# Patient Record
Sex: Female | Born: 1980
Health system: Southern US, Community
[De-identification: ages and names within clinical notes are randomized; demographics above are authoritative.]

## PROBLEM LIST (undated history)

## (undated) DIAGNOSIS — B019 Varicella without complication: Secondary | ICD-10-CM

## (undated) HISTORY — PX: CYST EXCISION: SHX5701

## (undated) HISTORY — DX: Varicella without complication: B01.9

## (undated) HISTORY — PX: WISDOM TOOTH EXTRACTION: SHX21

---

## 2017-09-06 DIAGNOSIS — Z713 Dietary counseling and surveillance: Secondary | ICD-10-CM | POA: Diagnosis not present

## 2017-11-14 DIAGNOSIS — Z23 Encounter for immunization: Secondary | ICD-10-CM | POA: Diagnosis not present

## 2017-12-08 DIAGNOSIS — Z713 Dietary counseling and surveillance: Secondary | ICD-10-CM | POA: Diagnosis not present

## 2017-12-14 ENCOUNTER — Ambulatory Visit: Payer: BLUE CROSS/BLUE SHIELD | Admitting: Family Medicine

## 2017-12-14 ENCOUNTER — Encounter: Payer: Self-pay | Admitting: Family Medicine

## 2017-12-14 VITALS — BP 102/70 | HR 90 | Temp 98.7°F | Ht 63.0 in | Wt 121.0 lb

## 2017-12-14 DIAGNOSIS — Z131 Encounter for screening for diabetes mellitus: Secondary | ICD-10-CM

## 2017-12-14 DIAGNOSIS — Z0000 Encounter for general adult medical examination without abnormal findings: Secondary | ICD-10-CM | POA: Diagnosis not present

## 2017-12-14 DIAGNOSIS — Z13220 Encounter for screening for lipoid disorders: Secondary | ICD-10-CM | POA: Diagnosis not present

## 2017-12-14 LAB — BASIC METABOLIC PANEL
BUN: 12 mg/dL (ref 6–23)
CO2: 26 mEq/L (ref 19–32)
Calcium: 9.3 mg/dL (ref 8.4–10.5)
Chloride: 104 mEq/L (ref 96–112)
Creatinine, Ser: 0.6 mg/dL (ref 0.40–1.20)
GFR: 120.73 mL/min (ref >60.00)
Glucose, Bld: 90 mg/dL (ref 70–99)
Potassium: 3.9 mEq/L (ref 3.5–5.1)
Sodium: 139 mEq/L (ref 135–145)

## 2017-12-14 LAB — CBC WITH DIFFERENTIAL/PLATELET
Basophils Absolute: 0 10*3/uL (ref 0.0–0.1)
Basophils Relative: 0.9 % (ref 0.0–3.0)
Eosinophils Absolute: 0 10*3/uL (ref 0.0–0.7)
Eosinophils Relative: 0.9 % (ref 0.0–5.0)
HCT: 39.3 % (ref 36.0–46.0)
Hemoglobin: 13.3 g/dL (ref 12.0–15.0)
Lymphocytes Relative: 35.4 % (ref 12.0–46.0)
Lymphs Abs: 1.7 10*3/uL (ref 0.7–4.0)
MCHC: 33.9 g/dL (ref 30.0–36.0)
MCV: 90.9 fl (ref 78.0–100.0)
Monocytes Absolute: 0.2 10*3/uL (ref 0.1–1.0)
Monocytes Relative: 4.4 % (ref 3.0–12.0)
Neutro Abs: 2.8 10*3/uL (ref 1.4–7.7)
Neutrophils Relative %: 58.4 % (ref 43.0–77.0)
Platelets: 167 10*3/uL (ref 150.0–400.0)
RBC: 4.32 Mil/uL (ref 3.87–5.11)
RDW: 12.9 % (ref 11.5–15.5)
WBC: 4.8 10*3/uL (ref 4.0–10.5)

## 2017-12-14 LAB — LIPID PANEL
Cholesterol: 205 mg/dL — ABNORMAL HIGH (ref 0–200)
HDL: 42.5 mg/dL (ref >39.00)
LDL Cholesterol: 132 mg/dL — ABNORMAL HIGH (ref 0–99)
NonHDL: 162.36
Total CHOL/HDL Ratio: 5
Triglycerides: 151 mg/dL — ABNORMAL HIGH (ref 0.0–149.0)
VLDL: 30.2 mg/dL (ref 0.0–40.0)

## 2017-12-14 LAB — HEMOGLOBIN A1C: Hgb A1c MFr Bld: 5.2 % (ref 4.6–6.5)

## 2017-12-14 NOTE — Patient Instructions (Signed)
Preventive Care 18-39 Years, Female Preventive care refers to lifestyle choices and visits with your health care provider that can promote health and wellness. What does preventive care include?  A yearly physical exam. This is also called an annual well check.  Dental exams once or twice a year.  Routine eye exams. Ask your health care provider how often you should have your eyes checked.  Personal lifestyle choices, including: ? Daily care of your teeth and gums. ? Regular physical activity. ? Eating a healthy diet. ? Avoiding tobacco and drug use. ? Limiting alcohol use. ? Practicing safe sex. ? Taking vitamin and mineral supplements as recommended by your health care provider. What happens during an annual well check? The services and screenings done by your health care provider during your annual well check will depend on your age, overall health, lifestyle risk factors, and family history of disease. Counseling Your health care provider may ask you questions about your:  Alcohol use.  Tobacco use.  Drug use.  Emotional well-being.  Home and relationship well-being.  Sexual activity.  Eating habits.  Work and work Statistician.  Method of birth control.  Menstrual cycle.  Pregnancy history.  Screening You may have the following tests or measurements:  Height, weight, and BMI.  Diabetes screening. This is done by checking your blood sugar (glucose) after you have not eaten for a while (fasting).  Blood pressure.  Lipid and cholesterol levels. These may be checked every 5 years starting at age 66.  Skin check.  Hepatitis C blood test.  Hepatitis B blood test.  Sexually transmitted disease (STD) testing.  BRCA-related cancer screening. This may be done if you have a family history of breast, ovarian, tubal, or peritoneal cancers.  Pelvic exam and Pap test. This may be done every 3 years starting at age 40. Starting at age 59, this may be done every 5  years if you have a Pap test in combination with an HPV test.  Discuss your test results, treatment options, and if necessary, the need for more tests with your health care provider. Vaccines Your health care provider may recommend certain vaccines, such as:  Influenza vaccine. This is recommended every year.  Tetanus, diphtheria, and acellular pertussis (Tdap, Td) vaccine. You may need a Td booster every 10 years.  Varicella vaccine. You may need this if you have not been vaccinated.  HPV vaccine. If you are 69 or younger, you may need three doses over 6 months.  Measles, mumps, and rubella (MMR) vaccine. You may need at least one dose of MMR. You may also need a second dose.  Pneumococcal 13-valent conjugate (PCV13) vaccine. You may need this if you have certain conditions and were not previously vaccinated.  Pneumococcal polysaccharide (PPSV23) vaccine. You may need one or two doses if you smoke cigarettes or if you have certain conditions.  Meningococcal vaccine. One dose is recommended if you are age 27-21 years and a first-year college student living in a residence hall, or if you have one of several medical conditions. You may also need additional booster doses.  Hepatitis A vaccine. You may need this if you have certain conditions or if you travel or work in places where you may be exposed to hepatitis A.  Hepatitis B vaccine. You may need this if you have certain conditions or if you travel or work in places where you may be exposed to hepatitis B.  Haemophilus influenzae type b (Hib) vaccine. You may need this if  you have certain risk factors.  Talk to your health care provider about which screenings and vaccines you need and how often you need them. This information is not intended to replace advice given to you by your health care provider. Make sure you discuss any questions you have with your health care provider. Document Released: 04/12/2001 Document Revised: 11/04/2015  Document Reviewed: 12/16/2014 Elsevier Interactive Patient Education  [employer].

## 2017-12-14 NOTE — Progress Notes (Signed)
Subjective:     Julia Elliott is a 37 y.o. female and is here for a comprehensive physical exam. The patient reports no problems.  Patient states she is overall healthy and does not take any medications.  Allergies: NKDA  Past surgical history: None  Social history: Pt is single.  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  Pt is originally from [place].  She is moving to the area from [place] for work and to be closer to her boyfriend.  Pt endorses social alcohol use.  Pt denies tobacco and drug use.  Last Pap 2017 LMP December 08, 2017 Last eye exam 2017 Dentist--[facility]  Family medical history: Mom-AAW Dad-alive, HLD, HTN.  Social History   Socioeconomic History  . Marital status: Single    Spouse name: Not on file  . Number of children: Not on file  . Years of education: Not on file  . Highest education level: Not on file  Occupational History  . Not on file  Social Needs  . Financial resource strain: Not on file  . Food insecurity:    Worry: Not on file    Inability: Not on file  . Transportation needs:    Medical: Not on file    Non-medical: Not on file  Tobacco Use  . Smoking status: Never Smoker  . Smokeless tobacco: Never Used  Substance and Sexual Activity  . Alcohol use: Yes  . Drug use: Never  . Sexual activity: Yes  Lifestyle  . Physical activity:    Days per week: Not on file    Minutes per session: Not on file  . Stress: Not on file  Relationships  . Social connections:    Talks on phone: Not on file    Gets together: Not on file    Attends religious service: Not on file    Active member of club or organization: Not on file    Attends meetings of clubs or organizations: Not on file    Relationship status: Not on file  . Intimate partner violence:    Fear of current or ex partner: Not on file    Emotionally abused: Not on file    Physically abused: Not on file    Forced sexual  activity: Not on file  Other Topics Concern  . Not on file  Social History Narrative  . Not on file   Health Maintenance  Topic Date Due  . HIV Screening  09/09/1997  . TETANUS/TDAP  09/09/2001  . PAP SMEAR  11/27/2018  . INFLUENZA VACCINE  Completed    The following portions of the patient's history were reviewed and updated as appropriate: allergies, current medications, past family history, past medical history, past social history, past surgical history and problem list.  Review of Systems Pertinent items noted in HPI and remainder of comprehensive ROS otherwise negative.   Objective:    BP 102/70 (BP Location: Left Arm, Patient Position: Sitting, Cuff Size: Normal)   Pulse 90   Temp 98.7 F (37.1 C) (Oral)   Ht 5\' 3"  (1.6 m)   Wt 121 lb (54.9 kg)   LMP 12/08/2017   SpO2 98%   BMI 21.43 kg/m  General appearance: alert, cooperative, appears stated age and no distress Head: Normocephalic, without obvious abnormality, atraumatic Eyes: conjunctivae/corneas clear. PERRL, EOM's intact. Fundi benign. Ears: normal TM's and external ear canals both ears Nose: Nares normal. Septum midline. Mucosa normal. No drainage or sinus tenderness. Throat:  lips, mucosa, and tongue normal; teeth and gums normal Neck: no adenopathy, no carotid bruit, no JVD, supple, symmetrical, trachea midline and thyroid not enlarged, symmetric, no tenderness/mass/nodules Lungs: clear to auscultation bilaterally Heart: regular rate and rhythm, S1, S2 normal, no murmur, click, rub or gallop Abdomen: soft, non-tender; bowel sounds normal; no masses,  no organomegaly Extremities: extremities normal, atraumatic, no cyanosis or edema Skin: Skin color, texture, turgor normal. No rashes or lesions Neurologic: Alert and oriented X 3, normal strength and tone. Normal symmetric reflexes. Normal coordination and gait    Assessment:    Healthy female exam.      Plan:     Anticipatory guidance given including  wearing seatbelts, smoke detectors in the home, increasing physical activity, increasing p.o. intake of water and vegetables. -will obtain labs for form for pt's job -pap up to date -offer influenza vaccine -next CPE in 1 yr See After Visit Summary for Counseling Recommendations    F/u prn  Grier Mitts, MD

## 2017-12-18 ENCOUNTER — Telehealth: Payer: Self-pay

## 2017-12-18 NOTE — Telephone Encounter (Signed)
Pt Health Assessment form has been completed and faxed to the fax number provided,confirmation received

## 2018-01-05 DIAGNOSIS — Z713 Dietary counseling and surveillance: Secondary | ICD-10-CM | POA: Diagnosis not present

## 2018-01-31 DIAGNOSIS — Z713 Dietary counseling and surveillance: Secondary | ICD-10-CM | POA: Diagnosis not present

## 2018-03-09 DIAGNOSIS — Z713 Dietary counseling and surveillance: Secondary | ICD-10-CM | POA: Diagnosis not present

## 2018-04-04 DIAGNOSIS — Z713 Dietary counseling and surveillance: Secondary | ICD-10-CM | POA: Diagnosis not present

## 2018-06-07 ENCOUNTER — Ambulatory Visit: Payer: BLUE CROSS/BLUE SHIELD | Admitting: Family Medicine

## 2018-12-07 DIAGNOSIS — Z713 Dietary counseling and surveillance: Secondary | ICD-10-CM | POA: Diagnosis not present

## 2019-01-28 ENCOUNTER — Telehealth (INDEPENDENT_AMBULATORY_CARE_PROVIDER_SITE_OTHER): Payer: BC Managed Care – PPO | Admitting: Family Medicine

## 2019-01-28 DIAGNOSIS — Z7189 Other specified counseling: Secondary | ICD-10-CM | POA: Diagnosis not present

## 2019-01-28 DIAGNOSIS — J069 Acute upper respiratory infection, unspecified: Secondary | ICD-10-CM

## 2019-01-28 NOTE — Progress Notes (Signed)
Virtual Visit via Video Note  I connected with Julia Elliott on 01/28/19 at 11:00 AM EST by a video enabled telemedicine application 2/2 [id number] pandemic and verified that I am speaking with the correct person using two identifiers.  Location patient: home Location provider:work or home office Persons participating in the virtual visit: patient, provider  I discussed the limitations of evaluation and management by telemedicine and the availability of in person appointments. The patient expressed understanding and agreed to proceed.   HPI: Pt with nasal congestion, rhinorrhea, HA, sore throat-improved, and cough in the afternoon .  Symptoms started 4 days ago.  Tried dayquil and nyquil.  Denies fever, ear pain, sick contacts, n/v, diarrhea, loss taste or smell.  Pt states symptoms feel like a cold, but she just wanted to check 2/2 COVID 19 pandemic.  ROS: See pertinent positives and negatives per HPI.  Past Medical History:  Diagnosis Date  . Chicken pox     No past surgical history on file.  Family History  Problem Relation Age of Onset  . Hypertension Father   . Hyperlipidemia Father      No current outpatient medications on file.  EXAM:  VITALS per patient if applicable: RR between 123456 bpm  GENERAL: alert, oriented, appears well and in no acute distress  HEENT: atraumatic, conjunctiva clear, no obvious abnormalities on inspection of external nose and ears  NECK: normal movements of the head and neck  LUNGS: on inspection no signs of respiratory distress, breathing rate appears normal, no obvious gross SOB, gasping or wheezing  CV: no obvious cyanosis  MS: moves all visible extremities without noticeable abnormality  PSYCH/NEURO: pleasant and cooperative, no obvious depression or anxiety, speech and thought processing grossly intact  ASSESSMENT AND PLAN:  Discussed the following assessment and plan:  Viral upper respiratory tract infection -discussed  supportive care -consider flonase.  Offered rx.  Pt declined, will get OTC. -given precautions  Educated about COVID-19 virus infection -discussed s/s of COVID 19 infection -Advised on community testing sites   F/u prn for continued or worsening symptoms  I discussed the assessment and treatment plan with the patient. The patient was provided an opportunity to ask questions and all were answered. The patient agreed with the plan and demonstrated an understanding of the instructions.   The patient was advised to call back or seek an in-person evaluation if the symptoms worsen or if the condition fails to improve as anticipated.   Billie Ruddy, MD

## 2019-10-17 ENCOUNTER — Encounter: Payer: Self-pay | Admitting: Family Medicine

## 2019-11-06 ENCOUNTER — Telehealth: Payer: Self-pay | Admitting: *Deleted

## 2019-11-06 NOTE — Telephone Encounter (Signed)
Called and left the patient a message to call the office back. Patient needs to be scheduled for a new patient appt  °

## 2019-11-06 NOTE — Telephone Encounter (Signed)
Patient called back and scheduled an appt for tomorrow at 9:45 am with Dr Berline Lopes. Gave the patient the address and phone number for the clinic. Gave the policy for mask, visitors and parking

## 2019-11-07 ENCOUNTER — Other Ambulatory Visit: Payer: Self-pay | Admitting: Gynecologic Oncology

## 2019-11-07 ENCOUNTER — Inpatient Hospital Stay: Payer: 59 | Attending: Gynecologic Oncology | Admitting: Gynecologic Oncology

## 2019-11-07 ENCOUNTER — Other Ambulatory Visit: Payer: Self-pay

## 2019-11-07 ENCOUNTER — Encounter: Payer: Self-pay | Admitting: Gynecologic Oncology

## 2019-11-07 ENCOUNTER — Ambulatory Visit (HOSPITAL_COMMUNITY)
Admission: RE | Admit: 2019-11-07 | Discharge: 2019-11-07 | Disposition: A | Discharge status: Home / Self Care | Payer: 59 | Source: Ambulatory Visit | Attending: Gynecologic Oncology | Admitting: Gynecologic Oncology

## 2019-11-07 VITALS — BP 132/79 | HR 100 | Temp 97.3°F | Resp 18 | Ht 63.0 in | Wt 110.8 lb

## 2019-11-07 DIAGNOSIS — N838 Other noninflammatory disorders of ovary, fallopian tube and broad ligament: Secondary | ICD-10-CM

## 2019-11-07 DIAGNOSIS — N809 Endometriosis, unspecified: Secondary | ICD-10-CM | POA: Diagnosis not present

## 2019-11-07 DIAGNOSIS — D3911 Neoplasm of uncertain behavior of right ovary: Secondary | ICD-10-CM | POA: Insufficient documentation

## 2019-11-07 DIAGNOSIS — N840 Polyp of corpus uteri: Secondary | ICD-10-CM | POA: Diagnosis not present

## 2019-11-07 DIAGNOSIS — R971 Elevated cancer antigen 125 [CA 125]: Secondary | ICD-10-CM | POA: Insufficient documentation

## 2019-11-07 DIAGNOSIS — N83202 Unspecified ovarian cyst, left side: Secondary | ICD-10-CM | POA: Insufficient documentation

## 2019-11-07 MED ORDER — GADOBUTROL 1 MMOL/ML IV SOLN
5.0000 mL | Freq: Once | INTRAVENOUS | Status: AC | PRN
  Administered 2019-11-07: 5 mL via INTRAVENOUS

## 2019-11-07 NOTE — Patient Instructions (Addendum)
Plan to have an MRI and Dr. Denman George will contact you with the results to discuss whether surgery should be performed on Tuesday or could wait for you to get back from your trip in October. We are working on getting your MRI prior authorized with your insurance and then we will contact you with the date and location.  Preparing for your Surgery  Plan for surgery on November 12, 2019 (checking availability currently and will contact you to confirm) with Dr. Everitt Amber at [facility] will be scheduled for a robotic assisted unilateral salpingo-oophorectomy (removal of one ovary and fallopian tube), possible staging if cancer identified, hysteroscopy with dilation and curettage of the uterus and polypectomy.   Pre-operative Testing -You will receive a phone call from presurgical testing at [facility] to arrange for a pre-operative appointment over the phone, lab appointment, and COVID test. The COVID test normally happens 3 days prior to the surgery and they ask that you self quarantine after the test up until surgery to decrease chance of exposure.  -Bring your insurance card, copy of an advanced directive if applicable, medication list  -At that visit, you will be asked to sign a consent for a possible blood transfusion in case a transfusion becomes necessary during surgery.  The need for a blood transfusion is rare but having consent is a necessary part of your care.     -You should not be taking blood thinners or aspirin at least ten days prior to surgery unless instructed by your surgeon.  -Do not take supplements such as fish oil (omega 3), red yeast rice, turmeric before your surgery.   Day Before Surgery at [facility] will be asked to take in a light diet the day before surgery. You will be advised you can have clear liquids up until 3 hours before your surgery.    Eat a light diet the day before surgery.  Examples including soups, broths, toast, yogurt, mashed potatoes.   AVOID GAS PRODUCING FOODS. Things to avoid include carbonated beverages (fizzy beverages), raw fruits and raw vegetables, or beans.   If your bowels are filled with gas, your surgeon will have difficulty visualizing your pelvic organs which increases your surgical risks.  Your role in recovery Your role is to become active as soon as directed by your doctor, while still giving yourself time to heal.  Rest when you feel tired. You will be asked to do the following in order to speed your recovery:  - Cough and breathe deeply. This helps to clear and expand your lungs and can prevent pneumonia after surgery.  - [place]. Do mild physical activity. Walking or moving your legs help your circulation and body functions return to normal. Do not try to get up or walk alone the first time after surgery.   -If you develop swelling on one leg or the other, pain in the back of your leg, redness/warmth in one of your legs, please call the office or go to the Emergency Room to have a doppler to rule out a blood clot. For shortness of breath, chest pain-seek care in the Emergency Room as soon as possible. - Actively manage your pain. Managing your pain lets you move in comfort. We will ask you to rate your pain on a scale of zero to 10. It is your responsibility to tell your doctor or nurse where and how much you hurt so your pain can be treated.  Special  Considerations -If you are diabetic, you may be placed on insulin after surgery to have closer control over your blood sugars to promote healing and recovery.  This does not mean that you will be discharged on insulin.  If applicable, your oral antidiabetics will be resumed when you are tolerating a solid diet.  -Your final pathology results from surgery should be available around one week after surgery and the results will be relayed to you when available.  -Dr. Lahoma Crocker is the surgeon that assists your GYN Oncologist with surgery.   If you end up staying the night, the next day after your surgery you will either see Dr. Denman George, Dr. Berline Lopes, or Dr. Lahoma Crocker.  -FMLA forms can be faxed to [phone] and please allow 5-7 business days for completion.  Pain Management After Surgery -You will be prescribed your pain medication and bowel regimen medications before surgery so that you can have these available when you are discharged from the hospital. The pain medication is for use ONLY AFTER surgery and a new prescription will not be given.   -Make sure that you have Tylenol and Ibuprofen at home to use on a regular basis after surgery for pain control. We recommend alternating the medications every hour to six hours since they work differently and are processed in the body differently for pain relief.  -Review the attached handout on narcotic use and their risks and side effects.   Bowel Regimen -You will be prescribed Sennakot-S to take nightly to prevent constipation especially if you are taking the narcotic pain medication intermittently.  It is important to prevent constipation and drink adequate amounts of liquids. You can stop taking this medication when you are not taking pain medication and you are back on your normal bowel routine.  Risks of Surgery Risks of surgery are low but include bleeding, infection, damage to surrounding structures, re-operation, blood clots, and very rarely death.   Blood Transfusion Information (For the consent to be signed before surgery)  We will be checking your blood type before surgery so in case of emergencies, we will know what type of blood you would need.                                            WHAT IS A BLOOD TRANSFUSION?  A transfusion is the replacement of blood or some of its parts. Blood is made up of multiple cells which provide different functions.  Red blood cells carry oxygen and are used for blood loss replacement.  White blood cells fight against  infection.  Platelets control bleeding.  Plasma helps clot blood.  Other blood products are available for specialized needs, such as hemophilia or other clotting disorders. BEFORE THE TRANSFUSION  Who gives blood for transfusions?   You may be able to donate blood to be used at a later date on yourself (autologous donation).  Relatives can be asked to donate blood. This is generally not any safer than if you have received blood from a stranger. The same precautions are taken to ensure safety when a relative's blood is donated.  Healthy volunteers who are fully evaluated to make sure their blood is safe. This is blood bank blood. Transfusion therapy is the safest it has ever been in the practice of medicine. Before blood is taken from a donor, a complete history is taken to make sure  that person has no history of diseases nor engages in risky social behavior (examples are intravenous drug use or sexual activity with multiple partners). The donor's travel history is screened to minimize risk of transmitting infections, such as malaria. The donated blood is tested for signs of infectious diseases, such as HIV and hepatitis. The blood is then tested to be sure it is compatible with you in order to minimize the chance of a transfusion reaction. If you or a relative donates blood, this is often done in anticipation of surgery and is not appropriate for emergency situations. It takes many days to process the donated blood. RISKS AND COMPLICATIONS Although transfusion therapy is very safe and saves many lives, the main dangers of transfusion include:   Getting an infectious disease.  Developing a transfusion reaction. This is an allergic reaction to something in the blood you were given. Every precaution is taken to prevent this. The decision to have a blood transfusion has been considered carefully by your caregiver before blood is given. Blood is not given unless the benefits outweigh the  risks.  AFTER SURGERY INSTRUCTIONS  Return to work: 3-4 weeks if applicable  Activity: 1. Be up and out of the bed during the day.  Take a nap if needed.  You may walk up steps but be careful and use the hand rail.  Stair climbing will tire you more than you think, you may need to stop part way and rest.   2. No lifting or straining for 6 weeks over 10 pounds. No pushing, pulling, straining for 6 weeks.  3. No driving for 1 week(s).  Do not drive if you are taking narcotic pain medicine and make sure that your reaction time has returned.   4. You can shower as soon as the next day after surgery. Shower daily.  Use your regular soap and water (not directly on the incision) and pat your incision(s) dry afterwards; don't rub.  No tub baths or submerging your body in water until cleared by your surgeon. If you have the soap that was given to you by pre-surgical testing that was used before surgery, you do not need to use it afterwards because this can irritate your incisions.   5. No sexual activity and nothing in the vagina for 4 weeks.  6. You may experience a small amount of clear drainage from your incisions, which is normal.  If the drainage persists, increases, or changes color please call the office.  7. Do not use creams, lotions, or ointments such as neosporin on your incisions after surgery until advised by your surgeon because they can cause removal of the dermabond glue on your incisions.    8. You may experience vaginal spotting after surgery.  The spotting is normal but if you experience heavy bleeding, call our office.  9. Take Tylenol or ibuprofen first for pain and only use narcotic pain medication for severe pain not relieved by the Tylenol or Ibuprofen.  Monitor your Tylenol intake to a max of 4,000 mg in a 24 hour period. You can alternate these medications after surgery.  Diet: 1. Low sodium Heart Healthy Diet is recommended but you are cleared to resume your normal (before  surgery) diet after your procedure.  2. It is safe to use a laxative, such as Miralax or Colace, if you have difficulty moving your bowels. You have been prescribed Sennakot at bedtime every evening to keep bowel movements regular and to prevent constipation.    Wound  Care: 1. Keep clean and dry.  Shower daily.  Reasons to call the Doctor:  Fever - Oral temperature greater than 100.4 degrees Fahrenheit  Foul-smelling vaginal discharge  Difficulty urinating  Nausea and vomiting  Increased pain at the site of the incision that is unrelieved with pain medicine.  Difficulty breathing with or without chest pain  New calf pain especially if only on one side  Sudden, continuing increased vaginal bleeding with or without clots.   Contacts: For questions or concerns you should contact:  Dr. Everitt Amber at [phone]  Joylene John, NP at [phone]  After Hours: call [phone] and have the GYN Oncologist paged/contacted

## 2019-11-07 NOTE — Patient Instructions (Addendum)
DUE TO COVID-19 ONLY ONE VISITOR IS ALLOWED TO COME WITH YOU AND STAY IN THE WAITING ROOM ONLY DURING PRE OP AND PROCEDURE DAY OF SURGERY. THE 1 VISITOR  MAY VISIT WITH YOU AFTER SURGERY IN YOUR PRIVATE ROOM DURING VISITING HOURS ONLY!  YOU NEED TO HAVE A COVID 19 TEST ON: 11/08/19 @ 9:40 am, THIS TEST MUST BE DONE BEFORE SURGERY,  COVID TESTING SITE [place] [address], IT IS ON THE RIGHT GOING OUT [place] APPROXIMATELY  2 MINUTES PAST [employer] ON THE RIGHT. ONCE YOUR COVID TEST IS COMPLETED,  PLEASE BEGIN THE QUARANTINE INSTRUCTIONS AS OUTLINED IN YOUR HANDOUT.                Julia Elliott    Your procedure is scheduled on: 11/12/19   Report to [facility] Main  Entrance   Report to admitting at: 8:30 AM     Call this number if you have problems the morning of surgery [phone]    Remember: Do not eat solid food :After Midnight. Clear liquids from midnight until: 7:30 am.   CLEAR LIQUID DIET   Foods Allowed                                                                     Foods Excluded  Coffee and tea, regular and decaf                             liquids that you cannot  Plain Jell-O any favor except red or purple                                           see through such as: Fruit ices (not with fruit pulp)                                     milk, soups, orange juice  Iced Popsicles                                    All solid food Carbonated beverages, regular and diet                                    Cranberry, grape and apple juices Sports drinks like Gatorade Lightly seasoned clear broth or consume(fat free) Sugar, honey syrup  Sample Menu Breakfast                                Lunch                                     Supper Cranberry juice  Beef broth                            Chicken broth Jell-O                                     Grape juice                           Apple  juice Coffee or tea                        Jell-O                                      Popsicle                                                Coffee or tea                        Coffee or tea  _____________________________________________________________________  BRUSH YOUR TEETH MORNING OF SURGERY AND RINSE YOUR MOUTH OUT, NO CHEWING GUM CANDY OR MINTS.   You may not have any metal on your body including hair pins and              piercings  Do not wear jewelry, make-up, lotions, powders or perfumes, deodorant             Do not wear nail polish on your fingernails.  Do not shave  48 hours prior to surgery.           Do not bring valuables to the hospital. [place].  Contacts, dentures or bridgework may not be worn into surgery.  Leave suitcase in the car. After surgery it may be brought to your room.     Patients discharged the day of surgery will not be allowed to drive home. IF YOU ARE HAVING SURGERY AND GOING HOME THE SAME DAY, YOU MUST HAVE AN ADULT TO DRIVE YOU HOME AND BE WITH YOU FOR 24 HOURS. YOU MAY GO HOME BY TAXI OR UBER OR ORTHERWISE, BUT AN ADULT MUST ACCOMPANY YOU HOME AND STAY WITH YOU FOR 24 HOURS.  Name and phone number of your driver:  Special Instructions: N/A              Please read over the following fact sheets you were given: _____________________________________________________________________        [facility] - Preparing for Surgery Before surgery, you can play an important role.  Because skin is not sterile, your skin needs to be as free of germs as possible.  You can reduce the number of germs on your skin by washing with CHG (chlorahexidine gluconate) soap before surgery.  CHG is an antiseptic cleaner which kills germs and bonds with the skin to continue killing germs even after washing. Please DO NOT use if you have an allergy to CHG or antibacterial soaps.  If your skin becomes reddened/irritated stop  using  the CHG and inform your nurse when you arrive at Short Stay. Do not shave (including legs and underarms) for at least 48 hours prior to the first CHG shower.  You may shave your face/neck. Please follow these instructions carefully:  1.  Shower with CHG Soap the night before surgery and the  morning of Surgery.  2.  If you choose to wash your hair, wash your hair first as usual with your  normal  shampoo.  3.  After you shampoo, rinse your hair and body thoroughly to remove the  shampoo.                           4.  Use CHG as you would any other liquid soap.  You can apply chg directly  to the skin and wash                       Gently with a scrungie or clean washcloth.  5.  Apply the CHG Soap to your body ONLY FROM THE NECK DOWN.   Do not use on face/ open                           Wound or open sores. Avoid contact with eyes, ears mouth and genitals (private parts).                       Wash face,  Genitals (private parts) with your normal soap.             6.  Wash thoroughly, paying special attention to the area where your surgery  will be performed.  7.  Thoroughly rinse your body with warm water from the neck down.  8.  DO NOT shower/wash with your normal soap after using and rinsing off  the CHG Soap.                9.  Pat yourself dry with a clean towel.            10.  Wear clean pajamas.            11.  Place clean sheets on your bed the night of your first shower and do not  sleep with pets. Day of Surgery : Do not apply any lotions/deodorants the morning of surgery.  Please wear clean clothes to the hospital/surgery center.  FAILURE TO FOLLOW THESE INSTRUCTIONS MAY RESULT IN THE CANCELLATION OF YOUR SURGERY PATIENT SIGNATURE_________________________________  NURSE SIGNATURE__________________________________  ________________________________________________________________________

## 2019-11-07 NOTE — Progress Notes (Signed)
Consult Note: Gyn-Onc  Consult was requested by Dr. Ronita Hipps for the evaluation of Julia Elliott 39 y.o. female  CC:  Chief Complaint  Patient presents with  . right ovarian mass    Assessment/Plan:  [name]  is a 39 y.o.  year old with a complex right ovarian cystic mass and elevated CA 125. I favor this being either an endometrioma or dermoid given the constellation of symptoms. With the elevated tumor marker, I recommend further imaging before discounting this as likely benign and putting off surgery until her return from her honeymoon in early November.  Therefore, we will obtain an MRI of the pelvis today. If this shows features most reassuring for endometrioma or dermoid, [name] is inclined to wait until return from her honeymoon before proceeding with surgery (to avoid facing surgical complications that might require cancellation of her trip or seeking care while oversees).  If this is more worrisome/less reassuring on MRI, I favor proceeding with robotic assisted USO, fertility sparing staging if malignancy is identified. We could perform this procedure as soon as 11/11/19. I feel that, should recovery be uncomplicated, that will give [place] enough time to see recovery without postponement of her wedding or honeymoon.  Due to her concurrent endometrial polyp, I recommend hysteroscopy with D&C and polypectomy as a concurrent procedure to ameliorate her bleeding symptoms.   I discussed surgical risks including  bleeding, infection, damage to internal organs (such as bladder,ureters, bowels), blood clot, reoperation and rehospitalization. I discussed anticipated hospital stay and course of recovery/restrictions.   HPI: [name] is a 39 year old P0 who was seen in consultation at the request of Dr Ronita Hipps for evaluation of a right complex ovarian cyst and elevated CA 125.  The patient had recently relocated to [facility] and needed to see gyn care. She had been  experiencing cyclical pelvic pain during menses for approximately 6 months and heavy menstrual cycles.  She was seen by Dr Ronita Hipps and a TVUS was performed on 10/16/19 which showed a 7.7x5.5x7.3cm right ovary with an echogenic complex cyst and hypoechoic septation. There was no internal blood flow. The appearance was most consistent with a dermoid. A 1cm endometrial polyp was also seen from anterior fundus. A simple left ovarian cyst was also seen. CA125 was drawn on 11/01/19 and this was elevated to 216. Her CEA was normal at 1.4.  Her medical history is unremarkable.  Her surgical history is unremarkable (no abdominal surgery).  Her gynecologic history is remarkable for menorrhagia . She does not desire future fertility, but values ovarian preservation (hormonal).  Her family cancer history is unremarkable for breast/ovarian cancer.  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 She lives with her fiancee and plan to marry in October, 2021 followed by a 3 week vacation to [place], [place], [place] etc.   Current Meds:  Outpatient Encounter Medications as of 11/07/2019  Medication Sig  . Multiple Vitamin (MULTIVITAMIN ADULT PO) Take 1 tablet by mouth daily.    No facility-administered encounter medications on file as of 11/07/2019.    Allergy: No Known Allergies  Social Hx:   Social History   Socioeconomic History  . Marital status: Single    Spouse name: Not on file  . Number of children: Not on file  . Years of education: Not on file  . Highest education level: Not on file  Occupational History  . Not on file  Tobacco Use  . Smoking status: Never Smoker  .  Smokeless tobacco: Never Used  Substance and Sexual Activity  . Alcohol use: Yes  . Drug use: Never  . Sexual activity: Yes  Other Topics Concern  . Not on file  Social History Narrative  . Not on file   Social Determinants of Health   Financial Resource Strain:   . Difficulty of Paying Living Expenses: Not on file  Food  Insecurity:   . Worried About Charity fundraiser in the Last Year: Not on file  . Ran Out of Food in the Last Year: Not on file  Transportation Needs:   . Lack of Transportation (Medical): Not on file  . Lack of Transportation (Non-Medical): Not on file  Physical Activity:   . Days of Exercise per Week: Not on file  . Minutes of Exercise per Session: Not on file  Stress:   . Feeling of Stress : Not on file  Social Connections:   . Frequency of Communication with Friends and Family: Not on file  . Frequency of Social Gatherings with Friends and Family: Not on file  . Attends Religious Services: Not on file  . Active Member of Clubs or Organizations: Not on file  . Attends Archivist Meetings: Not on file  . Marital Status: Not on file  Intimate Partner Violence:   . Fear of Current or Ex-Partner: Not on file  . Emotionally Abused: Not on file  . Physically Abused: Not on file  . Sexually Abused: Not on file    Past Surgical Hx: History reviewed. No pertinent surgical history.  Past Medical Hx:  Past Medical History:  Diagnosis Date  . Chicken pox     Past Gynecological History:  See HPI No LMP recorded.  Family Hx:  Family History  Problem Relation Age of Onset  . Hypertension Father   . Hyperlipidemia Father     Review of Systems:  Constitutional  Feels well,   ENT Normal appearing ears and nares bilaterally Skin/Breast  No rash, sores, jaundice, itching, dryness Cardiovascular  No chest pain, shortness of breath, or edema  Pulmonary  No cough or wheeze.  Gastro Intestinal  No nausea, vomitting, or diarrhoea. No bright red blood per rectum, no abdominal pain, change in bowel movement, or constipation.  Genito Urinary  No frequency, urgency, dysuria, + dysmenorrhea Musculo Skeletal  No myalgia, arthralgia, joint swelling or pain  Neurologic  No weakness, numbness, change in gait,  Psychology  No depression, anxiety, insomnia.   Vitals:  Blood  pressure 132/79, pulse 100, temperature (!) 97.3 F (36.3 C), temperature source Tympanic, resp. rate 18, height 5\' 3"  (1.6 m), weight 110 lb 12.8 oz (50.3 kg), SpO2 100 %.  Physical Exam: WD in NAD Neck  Supple NROM, without any enlargements.  Lymph Node Survey No cervical supraclavicular or inguinal adenopathy Cardiovascular  Pulse normal rate, regularity and rhythm. S1 and S2 normal.  Lungs  Clear to auscultation bilateraly, without wheezes/crackles/rhonchi. Good air movement.  Skin  No rash/lesions/breakdown  Psychiatry  Alert and oriented to person, place, and time  Abdomen  Normoactive bowel sounds, abdomen soft, non-tender and thin without evidence of hernia.  Back No CVA tenderness Genito Urinary  Vulva/vagina: Normal external female genitalia.  No lesions. No discharge or bleeding.  Bladder/urethra:  No lesions or masses, well supported bladder  Vagina: normal  Cervix: Normal appearing, no lesions.  Uterus: retroverted Small, mobile, no parametrial involvement or nodularity.  Adnexa: no discretely palpable masses, however fullness appreciated on right. Rectal  Good tone, no masses no cul de sac nodularity.  Extremities  No bilateral cyanosis, clubbing or edema.  60 minutes of total time was spent for this patient encounter, including preparation, face-to-face counseling with the patient and coordination of care, review of imaging (results and images), communication with the referring provider and documentation of the encounter.   Thereasa Solo, MD  11/07/2019, 6:30 PM

## 2019-11-08 ENCOUNTER — Telehealth: Payer: Self-pay

## 2019-11-08 ENCOUNTER — Encounter (HOSPITAL_COMMUNITY)
Admission: RE | Admit: 2019-11-08 | Discharge: 2019-11-08 | Disposition: A | Discharge status: Home / Self Care | Payer: 59 | Source: Ambulatory Visit | Attending: Gynecologic Oncology | Admitting: Gynecologic Oncology

## 2019-11-08 ENCOUNTER — Encounter (HOSPITAL_COMMUNITY): Payer: Self-pay

## 2019-11-08 ENCOUNTER — Other Ambulatory Visit: Payer: Self-pay

## 2019-11-08 ENCOUNTER — Other Ambulatory Visit (HOSPITAL_COMMUNITY)
Admission: RE | Admit: 2019-11-08 | Discharge: 2019-11-08 | Disposition: A | Discharge status: Home / Self Care | Payer: 59 | Source: Ambulatory Visit | Attending: Gynecologic Oncology | Admitting: Gynecologic Oncology

## 2019-11-08 DIAGNOSIS — Z01812 Encounter for preprocedural laboratory examination: Secondary | ICD-10-CM | POA: Insufficient documentation

## 2019-11-08 DIAGNOSIS — Z20822 Contact with and (suspected) exposure to covid-19: Secondary | ICD-10-CM | POA: Diagnosis not present

## 2019-11-08 LAB — CBC
HCT: 40.4 % (ref 36.0–46.0)
Hemoglobin: 13.5 g/dL (ref 12.0–15.0)
MCH: 31 pg (ref 26.0–34.0)
MCHC: 33.4 g/dL (ref 30.0–36.0)
MCV: 92.9 fL (ref 80.0–100.0)
Platelets: 166 10*3/uL (ref 150–400)
RBC: 4.35 MIL/uL (ref 3.87–5.11)
RDW: 13.2 % (ref 11.5–15.5)
WBC: 4.5 10*3/uL (ref 4.0–10.5)
nRBC: 0 % (ref 0.0–0.2)

## 2019-11-08 LAB — COMPREHENSIVE METABOLIC PANEL
ALT: 15 U/L (ref 0–44)
AST: 16 U/L (ref 15–41)
Albumin: 4.1 g/dL (ref 3.5–5.0)
Alkaline Phosphatase: 39 U/L (ref 38–126)
Anion gap: 9 (ref 5–15)
BUN: 14 mg/dL (ref 6–20)
CO2: 21 mmol/L — ABNORMAL LOW (ref 22–32)
Calcium: 9.1 mg/dL (ref 8.9–10.3)
Chloride: 109 mmol/L (ref 98–111)
Creatinine, Ser: 0.49 mg/dL (ref 0.44–1.00)
GFR calc Af Amer: >60 mL/min (ref >60)
GFR calc non Af Amer: >60 mL/min (ref >60)
Glucose, Bld: 105 mg/dL — ABNORMAL HIGH (ref 70–99)
Potassium: 3.8 mmol/L (ref 3.5–5.1)
Sodium: 139 mmol/L (ref 135–145)
Total Bilirubin: 0.6 mg/dL (ref 0.3–1.2)
Total Protein: 7.1 g/dL (ref 6.5–8.1)

## 2019-11-08 LAB — PREGNANCY, URINE: Preg Test, Ur: NEGATIVE

## 2019-11-08 LAB — URINALYSIS, ROUTINE W REFLEX MICROSCOPIC
Bilirubin Urine: NEGATIVE
Glucose, UA: NEGATIVE mg/dL
Hgb urine dipstick: NEGATIVE
Ketones, ur: NEGATIVE mg/dL
Nitrite: NEGATIVE
Protein, ur: NEGATIVE mg/dL
Specific Gravity, Urine: 1.003 — ABNORMAL LOW (ref 1.005–1.030)
pH: 7 (ref 5.0–8.0)

## 2019-11-08 LAB — SARS CORONAVIRUS 2 (TAT 6-24 HRS): SARS Coronavirus 2: NEGATIVE

## 2019-11-08 NOTE — Progress Notes (Signed)
COVID Vaccine Completed: Yes Date COVID Vaccine completed: 3/24 COVID vaccine manufacturer: Pfizer    PCP - Dr. Grier Mitts Cardiologist - NO  Chest x-ray -  EKG -  Stress Test -  ECHO -  Cardiac Cath -  Pacemaker/ICD device last checked:  Sleep Study -  CPAP -   Fasting Blood Sugar -  Checks Blood Sugar _____ times a day  Blood Thinner Instructions: Aspirin Instructions: Last Dose:  Anesthesia review:   Patient denies shortness of breath, fever, cough and chest pain at PAT appointment   Patient verbalized understanding of instructions that were given to them at the PAT appointment. Patient was also instructed that they will need to review over the PAT instructions again at home before surgery.

## 2019-11-08 NOTE — Telephone Encounter (Signed)
TC from patient.  Patient stated she wants to cancel surgery for now and reassess in November.

## 2019-11-11 NOTE — Telephone Encounter (Signed)
Spoke with Ms Dorow and she would like to r/s her surgery to 01-14-20. Joylene John, NP notified.

## 2019-11-12 LAB — TYPE AND SCREEN
ABO/RH(D): O POS
Antibody Screen: NEGATIVE

## 2019-11-15 ENCOUNTER — Telehealth: Payer: Self-pay

## 2019-11-15 ENCOUNTER — Other Ambulatory Visit: Payer: Self-pay | Admitting: Gynecologic Oncology

## 2019-11-15 DIAGNOSIS — N809 Endometriosis, unspecified: Secondary | ICD-10-CM

## 2019-11-15 MED ORDER — NORETHINDRONE ACET-ETHINYL EST 1.5-30 MG-MCG PO TABS: ORAL_TABLET | ORAL | 11 refills | Status: AC

## 2019-11-15 NOTE — Telephone Encounter (Signed)
Told Julia Elliott that the pain may not change with the BCP this cycle but hopefully with next cycle. She needs to take the Lo-estrin continuously.  She does not take the last row of inactive pills. Melissa sent the pills to her [facility] pharmacy. Pt verbalized understanding.

## 2019-11-15 NOTE — Telephone Encounter (Signed)
LM for Julia Elliott to call back to discuss BC pills and what pharmacy she would like to have them sent to.

## 2019-11-15 NOTE — Telephone Encounter (Signed)
TC from patient.  C/O increased radiating RLQ pain beginning on 11/14/19 that is only slightly relieved by advil.  Patient stated she has taken advil around the clock since onset.  LMP 11/09/19.  Patient stated Dr. Denman George discussed possible use of bcp for symptom management until surgery in November.  Please advise.

## 2019-11-26 ENCOUNTER — Encounter: Payer: Self-pay | Admitting: Gynecologic Oncology

## 2019-12-04 ENCOUNTER — Other Ambulatory Visit: Payer: Self-pay

## 2019-12-05 ENCOUNTER — Encounter: Payer: Self-pay | Admitting: Family Medicine

## 2019-12-05 ENCOUNTER — Ambulatory Visit (INDEPENDENT_AMBULATORY_CARE_PROVIDER_SITE_OTHER): Payer: 59 | Admitting: Family Medicine

## 2019-12-05 VITALS — BP 110/78 | HR 94 | Temp 98.0°F | Ht 63.0 in | Wt 111.4 lb

## 2019-12-05 DIAGNOSIS — Z0000 Encounter for general adult medical examination without abnormal findings: Secondary | ICD-10-CM

## 2019-12-05 DIAGNOSIS — N838 Other noninflammatory disorders of ovary, fallopian tube and broad ligament: Secondary | ICD-10-CM

## 2019-12-05 DIAGNOSIS — E785 Hyperlipidemia, unspecified: Secondary | ICD-10-CM | POA: Diagnosis not present

## 2019-12-05 DIAGNOSIS — Z7184 Encounter for health counseling related to travel: Secondary | ICD-10-CM | POA: Diagnosis not present

## 2019-12-05 DIAGNOSIS — B351 Tinea unguium: Secondary | ICD-10-CM

## 2019-12-05 MED ORDER — CIPROFLOXACIN HCL 500 MG PO TABS: ORAL_TABLET | ORAL | 0 refills | Status: DC

## 2019-12-05 MED ORDER — MEFLOQUINE HCL 250 MG PO TABS: ORAL_TABLET | ORAL | 0 refills | Status: DC

## 2019-12-05 NOTE — Patient Instructions (Addendum)
Preventive Care 21-37 Years Old, Female Preventive care refers to visits with your health care provider and lifestyle choices that can promote health and wellness. This includes:  A yearly physical exam. This may also be called an annual well check.  Regular dental visits and eye exams.  Immunizations.  Screening for certain conditions.  Healthy lifestyle choices, such as eating a healthy diet, getting regular exercise, not using drugs or products that contain nicotine and tobacco, and limiting alcohol use. What can I expect for my preventive care visit? Physical exam Your health care provider will check your:  Height and weight. This may be used to calculate body mass index (BMI), which tells if you are at a healthy weight.  Heart rate and blood pressure.  Skin for abnormal spots. Counseling Your health care provider may ask you questions about your:  Alcohol, tobacco, and drug use.  Emotional well-being.  Home and relationship well-being.  Sexual activity.  Eating habits.  Work and work environment.  Method of birth control.  Menstrual cycle.  Pregnancy history. What immunizations do I need?  Influenza (flu) vaccine  This is recommended every year. Tetanus, diphtheria, and pertussis (Tdap) vaccine  You may need a Td booster every 10 years. Varicella (chickenpox) vaccine  You may need this if you have not been vaccinated. Human papillomavirus (HPV) vaccine  If recommended by your health care provider, you may need three doses over 6 months. Measles, mumps, and rubella (MMR) vaccine  You may need at least one dose of MMR. You may also need a second dose. Meningococcal conjugate (MenACWY) vaccine  One dose is recommended if you are age 19-21 years and a first-year college student living in a residence hall, or if you have one of several medical conditions. You may also need additional booster doses. Pneumococcal conjugate (PCV13) vaccine  You may need  this if you have certain conditions and were not previously vaccinated. Pneumococcal polysaccharide (PPSV23) vaccine  You may need one or two doses if you smoke cigarettes or if you have certain conditions. Hepatitis A vaccine  You may need this if you have certain conditions or if you travel or work in places where you may be exposed to hepatitis A. Hepatitis B vaccine  You may need this if you have certain conditions or if you travel or work in places where you may be exposed to hepatitis B. Haemophilus influenzae type b (Hib) vaccine  You may need this if you have certain conditions. You may receive vaccines as individual doses or as more than one vaccine together in one shot (combination vaccines). Talk with your health care provider about the risks and benefits of combination vaccines. What tests do I need?  Blood tests  Lipid and cholesterol levels. These may be checked every 5 years starting at age 20.  Hepatitis C test.  Hepatitis B test. Screening  Diabetes screening. This is done by checking your blood sugar (glucose) after you have not eaten for a while (fasting).  Sexually transmitted disease (STD) testing.  BRCA-related cancer screening. This may be done if you have a family history of breast, ovarian, tubal, or peritoneal cancers.  Pelvic exam and Pap test. This may be done every 3 years starting at age 21. Starting at age 30, this may be done every 5 years if you have a Pap test in combination with an HPV test. Talk with your health care provider about your test results, treatment options, and if necessary, the need for more tests.   Follow these instructions at home: Eating and drinking   Eat a diet that includes fresh fruits and vegetables, whole grains, lean protein, and low-fat dairy.  Take vitamin and mineral supplements as recommended by your health care provider.  Do not drink alcohol if: ? Your health care provider tells you not to drink. ? You are  pregnant, may be pregnant, or are planning to become pregnant.  If you drink alcohol: ? Limit how much you have to 0-1 drink a day. ? Be aware of how much alcohol is in your drink. In the [place] one drink equals one 12 oz bottle of beer (355 mL), one 5 oz glass of wine (148 mL), or one 1 oz glass of hard liquor (44 mL). Lifestyle  Take daily care of your teeth and gums.  Stay active. Exercise for at least 30 minutes on 5 or more days each week.  Do not use any products that contain nicotine or tobacco, such as cigarettes, e-cigarettes, and chewing tobacco. If you need help quitting, ask your health care provider.  If you are sexually active, practice safe sex. Use a condom or other form of birth control (contraception) in order to prevent pregnancy and STIs (sexually transmitted infections). If you plan to become pregnant, see your health care provider for a preconception visit. What's next?  Visit your health care provider once a year for a well check visit.  Ask your health care provider how often you should have your eyes and teeth checked.  Stay up to date on all vaccines. This information is not intended to replace advice given to you by your health care provider. Make sure you discuss any questions you have with your health care provider. Document Revised: 10/26/2017 Document Reviewed: 10/26/2017 Elsevier Patient Education  2020 [place].  Preventing High Cholesterol Cholesterol is a white, waxy substance similar to fat that the human body needs to help build cells. The liver makes all the cholesterol that a person's body needs. Having high cholesterol (hypercholesterolemia) increases a person's risk for heart disease and stroke. Extra (excess) cholesterol comes from the food the person eats. High cholesterol can often be prevented with diet and lifestyle changes. If you already have high cholesterol, you can control it with diet and lifestyle changes and with medicine. How can  high cholesterol affect me? If you have high cholesterol, deposits (plaques) may build up on the walls of your arteries. The arteries are the blood vessels that carry blood away from your heart. Plaques make the arteries narrower and stiffer. This can limit or block blood flow and cause blood clots to form. Blood clots:  Are tiny balls of cells that form in your blood.  Can move to the heart or brain, causing a heart attack or stroke. Plaques in arteries greatly increase your risk for heart attack and stroke.Making diet and lifestyle changes can reduce your risk for these conditions that may threaten your life. What can increase my risk? This condition is more likely to develop in people who:  Eat foods that are high in saturated fat or cholesterol. Saturated fat is mostly found in: ? Foods that contain animal fat, such as red meat and some dairy products. ? Certain fatty foods made from plants, such as tropical oils.  Are overweight.  Are not getting enough exercise.  Have a family history of high cholesterol. What actions can I take to prevent this? Nutrition   Eat less saturated fat.  Avoid trans fats (partially hydrogenated oils).  These are often found in margarine and in some baked goods, fried foods, and snacks bought in packages.  Avoid precooked or cured meat, such as sausages or meat loaves.  Avoid foods and drinks that have added sugars.  Eat more fruits, vegetables, and whole grains.  Choose healthy sources of protein, such as fish, poultry, lean cuts of red meat, beans, peas, lentils, and nuts.  Choose healthy sources of fat, such as: ? Nuts. ? Vegetable oils, especially olive oil. ? Fish that have healthy fats (omega-3 fatty acids), such as mackerel or salmon. The items listed above may not be a complete list of recommended foods and beverages. Contact a dietitian for more information. Lifestyle  Lose weight if you are overweight. Losing 5-10 lb (2.3-4.5 kg)  can help prevent or control high cholesterol. It can also lower your risk for diabetes and high blood pressure. Ask your health care provider to help you with a diet and exercise plan to lose weight safely.  Do not use any products that contain nicotine or tobacco, such as cigarettes, e-cigarettes, and chewing tobacco. If you need help quitting, ask your health care provider.  Limit your alcohol intake. ? Do not drink alcohol if:  Your health care provider tells you not to drink.  You are pregnant, may be pregnant, or are planning to become pregnant. ? If you drink alcohol:  Limit how much you use to:  0-1 drink a day for women.  0-2 drinks a day for men.  Be aware of how much alcohol is in your drink. In the [place] one drink equals one 12 oz bottle of beer (355 mL), one 5 oz glass of wine (148 mL), or one 1 oz glass of hard liquor (44 mL). Activity   Get enough exercise. Each week, do at least 150 minutes of exercise that takes a medium level of effort (moderate-intensity exercise). ? This is exercise that:  Makes your heart beat faster and makes you breathe harder than usual.  Allows you to still be able to talk. ? You could exercise in short sessions several times a day or longer sessions a few times a week. For example, on 5 days each week, you could walk fast or ride your bike 3 times a day for 10 minutes each time.  Do exercises as told by your health care provider. Medicines  In addition to diet and lifestyle changes, your health care provider may recommend medicines to help lower cholesterol. This may be a medicine to lower the amount of cholesterol your liver makes. You may need medicine if: ? Diet and lifestyle changes do not lower your cholesterol enough. ? You have high cholesterol and other risk factors for heart disease or stroke.  Take over-the-counter and prescription medicines only as told by your health care provider. General information  Manage your risk  factors for high cholesterol. Talk with your health care provider about all your risk factors and how to lower your risk.  Manage other conditions that you have, such as diabetes or high blood pressure (hypertension).  Have blood tests to check your cholesterol levels at regular points in time as told by your health care provider.  Keep all follow-up visits as told by your health care provider. This is important. Where to find more information  American Heart Association: www.heart.org  National Heart, Lung, and Blood Institute: https://wilson-eaton.com/ Summary  High cholesterol increases your risk for heart disease and stroke. By keeping your cholesterol level low, you can reduce  your risk for these conditions.  High cholesterol can often be prevented with diet and lifestyle changes.  Work with your health care provider to manage your risk factors, and have your blood tested regularly. This information is not intended to replace advice given to you by your health care provider. Make sure you discuss any questions you have with your health care provider. Document Revised: 06/08/2018 Document Reviewed: 10/24/2015 Elsevier Patient Education  [place]. Mefloquine tablets What is this medicine? MEFLOQUINE (ME floe kwin) is used to treat or prevent malaria infections. This medicine may be used for other purposes; ask your health care provider or pharmacist if you have questions. COMMON BRAND NAME(S): Lariam What should I tell my health care provider before I take this medicine? They need to know if you have any of these conditions:  anxiety or panic attacks  confusion  depression or history of mental problems including anxiety disorder, schizophrenia, paranoia (mistrust towards others), or psychosis  heart disease  liver disease  restlessness  seizures (epilepsy or convulsions)  an unusual or allergic reaction to mefloquine, hydroxymefloquine, quinidine, quinine, other  medicines, foods, dyes, or preservatives  pregnant or trying to get pregnant  breast-feeding How should I use this medicine? Take this medicine by mouth with a full glass of water. Follow the directions on the prescription label. Take it with food. If you are taking this medicine to prevent malaria, you should start taking it one week before entering the area, and continue for 4 weeks after leaving. Take your doses at regular intervals and on the same day of each week. Do not take your medicine more often than directed. If you are treating an acute malaria infection, you will receive a single dose of the drug. For prolonged travel in an area where malaria is common, consult your healthcare provider for proper dosing schedule. A special MedGuide will be given to you by the pharmacist with each prescription and refill. Be sure to read this information carefully each time. Talk to your pediatrician regarding the use of this medicine in children. While this drug may be prescribed for children as young as 31 months of age for selected conditions, precautions do apply. Overdosage: If you think you have taken too much of this medicine contact a poison control center or emergency room at once. NOTE: This medicine is only for you. Do not share this medicine with others. What if I miss a dose? If you miss a dose, take it as soon as you can. If it is almost time for your next dose, take only that dose. Do not take double or extra doses. What may interact with this medicine? Do not take this medicine with any of the following medications:  certain medicines for fungal infections like fluconazole, itraconazole, ketoconazole, posaconazole, voriconazole  cisapride  dronedarone  halofantrine  pimozide  quinidine  quinine  thioridazine This medicine may also interact with the following medications:  chloroquine  certain medicines for depression, anxiety, or psychotic disturbances  certain  medicines for irregular heart beat  certain medicines for seizures like valproic acid, carbamazepine, phenobarbital, phenytoin  other medicines that prolong the QT interval (cause an abnormal heart rhythm) like dofetilide, ziprasidone  phenothiazines like chlorpromazine, mesoridazine, prochlorperazine, thioridazine  propranolol  typhoid vaccine This list may not describe all possible interactions. Give your health care provider a list of all the medicines, herbs, non-prescription drugs, or dietary supplements you use. Also tell them if you smoke, drink alcohol, or use illegal drugs.  Some items may interact with your medicine. What should I watch for while using this medicine? Tell your doctor or health care professional if your symptoms do not start to get better in a few days. If you are taking this medicine for a long time, visit your doctor or health care professional for regular checks. If you notice any changes in your vision see your eye doctor for an eye exam. If you get a fever during or after you start taking this medicine, do not treat yourself. Contact your doctor or health care professional immediately. You may get drowsy or dizzy. Do not drive, use machinery, or do anything that needs mental alertness until you know how this medicine affects you. Do not stand or sit up quickly, especially if you are an older patient. This reduces the risk of dizzy or fainting spells. While in areas where malaria is common, you should take steps to prevent being bit by mosquitos. This includes staying in air-conditioned or well-screened rooms to reduce human-mosquito contact, sleep under mosquito netting, preferably one with pyrethrum-containing insecticide, wear long-sleeved shirts or blouses and long trousers to protect arms and legs, apply mosquito repellents containing DEET to uncovered areas of skin, and use a pyrethrum-containing flying insect spray to kill mosquitos. If you are currently taking or  have taken this medicine in the past 3 weeks, you should not take halofantrine (another malarial drug). Dangerous heart side effects may occur. Talk to your health care provider. What side effects may I notice from receiving this medicine? Side effects that you should report to your doctor or health care professional as soon as possible:  anxious  blurred vision, or change in vision  confusion  depressed mood  dizziness  fainting spells  fever or chills  hallucination, loss of contact with reality  headaches, confusion, or other mental changes  hearing problems  joint or muscle aches  loss of balance or coordination  paranoid, feelings of mistrust  redness, blistering, peeling or loosening of the skin, including inside the mouth  restlessness  ringing in the ears  seizures  skin rash, itching (there may be severe itching without a rash)  trouble sleeping  unusual behavior  unusual changes in heart rate or other heart problems  unusually weak or tired  vomiting Side effects that usually do not require medical attention (report to your doctor or health care professional if they continue or are bothersome):  drowsiness  hair loss  insomnia  loss of appetite  mild diarrhea  nausea  stomach pain or upset This list may not describe all possible side effects. Call your doctor for medical advice about side effects. You may report side effects to FDA at 1-800-FDA-1088. Where should I keep my medicine? Keep out of the reach of children. Store at room temperature between 15 and 30 degrees C (59 and 86 degrees F). Throw away any unused medicine after the expiration date. NOTE: This sheet is a summary. It may not cover all possible information. If you have questions about this medicine, talk to your doctor, pharmacist, or health care provider.  2020 Elsevier/Gold Standard (2018-02-06 06:40:01)  TravelVaccine Information Vaccines (immunizations) can protect  you from certain diseases. If you plan to travel to another country, see your health care provider or a travel medicine specialist to discuss:  Where you are going. Include all countries in your travel schedule.  How long you are staying.  What you will be doing. Based on this information, your health care provider  may recommend:  Routine vaccines. These vaccines are standard for all children and adults.  Travel vaccines: ? For most travelers. These vaccines are recommended for most travelers before foreign travel. ? For some travelers. These vaccines may be necessary based on the destination country or region. It is important to see your health care provider at least 4-6 weeks before you travel. This allows time for recommended vaccines to take effect. It also provides enough time for you to get vaccines that must be given in a series over a period of days or weeks. If you have fewer than 4 weeks before you leave, you should still see your health care provider. You might still benefit from vaccines or medicines. What are routine vaccines? Routine vaccines are shots that can protect you from common diseases in many parts of the world. Most routine vaccines are given at certain ages starting in childhood. It is important that you are up to date on your routine vaccines before you travel. Routine vaccines include:  An annual flu (influenza) vaccine. The annual influenza vaccine sometimes differs for the [place] and [place] hemispheres. You should: ? Get both vaccines if you are traveling to the other hemisphere and you have a chronic medical condition. ? Get the vaccine shortly before or during the flu season, and only if the vaccine in your country differs from the vaccine in your destination country. ? Get the other influenza vaccine either before leaving the country or shortly after arriving at the destination country.  Age-related vaccines. ? Infants 6-11 months old should receive a  measles, mumps, and rubella (MMR) vaccine before travel to another country. ? Children and adults should be up to date with all recommended vaccines. ? Adults 99 years or older should talk to their health care provider about getting a vaccine against a certain type of pneumonia (pneumococcal) and a vaccine against shingles (herpes zoster).  Extra doses of certain vaccines (booster vaccines), such as Tdap (tetanus, diphtheria, and pertussis). What are recommended vaccines? Recommended travel vaccines change over time. Your health care provider can tell you what vaccines are recommended before your trip. Recommended vaccines will depend on:  The country or countries of travel.  Whether you will be traveling to rural areas.  How long you will be traveling.  The season of the year.  Your health status.  Your vaccine history. For most travelers The following vaccines are recommended for most international travelers, depending on the country or countries you are traveling to:  Hepatitis A.  Typhoid. For some travelers Additional vaccines may be required when traveling to certain countries, due to a disease being common in a particular area or an ongoing outbreak of a disease. The following vaccines may be recommended based on where you are traveling:  Yellow fever vaccine. This is required before traveling to certain countries in [place] and [place]. ? Get the yellow fever vaccine at an approved center at least 10 days before your trip. ? You will receive a stamp, certificate, or other proof of yellow fever immunization. ? If proof of immunization is incomplete or inaccurate, you could be medically isolated (quarantined), denied entry, or given another dose of vaccine at the travel site. ? If it has been longer than 10 years since you received the yellow fever vaccine, another dose is required.  Meningococcal vaccine. This may be required prior to travel to parts of [place] and [place]. ? Get this vaccine at least 10 days before your trip. After  10 days, most people show immunity to meningococcal disease. ? Proof of meningococcal immunization is required by the  for any person taking part in a Muslim pilgrimage. You may not receive a visa if you are not able to provide proof of immunization. ? If it has been longer than 3 years since your last immunization, another dose may be required.  Polio vaccine. If you travel to a country where there is a higher risk of getting polio, you may need a booster dose. ? Polio is a routine vaccine that most people receive as a child. Even if you completed the vaccine series as a child, you may need a booster dose before traveling to high-risk countries. ? Infants and children may need to follow an accelerated schedule to complete the polio vaccine series before traveling to high-risk countries. ? Some countries may require you to show proof that you have been vaccinated.  Depending on your travel plans, you may need additional vaccines, such as: ? Hepatitis B. ? Rabies. ? Tick-borne encephalitis. ? Cholera. Where to find more information  Centers for Disease Control and Prevention (CDC): http://www.wolf.info/  World Health Organization [facility]): RoleLink.com.br  [place]. Department of Health and Human Services: www.vaccines.gov Summary  Vaccines can protect you from certain diseases, and they can also prevent the spread of certain infections.  See your health care provider at least 4-6 weeks before you travel. This allows time for vaccines to take effect.  Vaccines for travelers include routine vaccines, recommended travel vaccines, and geographically required travel vaccines.  The most commonly recommended travel vaccines are the hepatitis A and typhoid vaccines. This information is not intended to replace advice given to you by your health care provider. Make sure you discuss any questions you have with your  health care provider. Document Revised: 08/02/2018 Document Reviewed: 03/17/2017 Elsevier Patient Education  2020 [place].  Fungal Nail Infection A fungal nail infection is a common infection of the toenails or fingernails. This condition affects toenails more often than fingernails. It often affects the great, or big, toes. More than one nail may be infected. The condition can be passed from person to person (is contagious). What are the causes? This condition is caused by a fungus. Several types of fungi can cause the infection. These fungi are common in moist and warm areas. If your hands or feet come into contact with the fungus, it may get into a crack in your fingernail or toenail and cause the infection. What increases the risk? The following factors may make you more likely to develop this condition:  Being female.  Being of older age.  Living with someone who has the fungus.  Walking barefoot in areas where the fungus thrives, such as showers or locker rooms.  Wearing shoes and socks that cause your feet to sweat.  Having a nail injury or a recent nail surgery.  Having certain medical conditions, such as: ? Athlete's foot. ? Diabetes. ? Psoriasis. ? Poor circulation. ? A weak body defense system (immune system). What are the signs or symptoms? Symptoms of this condition include:  A pale spot on the nail.  Thickening of the nail.  A nail that becomes yellow or brown.  A brittle or ragged nail edge.  A crumbling nail.  A nail that has lifted away from the nail bed. How is this diagnosed? This condition is diagnosed with a physical exam. Your health care provider may take a scraping or clipping from your nail  to test for the fungus. How is this treated? Treatment is not needed for mild infections. If you have significant nail changes, treatment may include:  Antifungal medicines taken by mouth (orally). You may need to take the medicine for several weeks or  several months, and you may not see the results for a long time. These medicines can cause side effects. Ask your health care provider what problems to watch for.  Antifungal nail polish or nail cream. These may be used along with oral antifungal medicines.  Laser treatment of the nail.  Surgery to remove the nail. This may be needed for the most severe infections. It can take a long time, usually up to a year, for the infection to go away. The infection may also come back. Follow these instructions at home: Medicines  Take or apply over-the-counter and prescription medicines only as told by your health care provider.  Ask your health care provider about using over-the-counter mentholated ointment on your nails. Nail care  Trim your nails often.  Wash and dry your hands and feet every day.  Keep your feet dry: ? Wear absorbent socks, and change your socks frequently. ? Wear shoes that allow air to circulate, such as sandals or canvas tennis shoes. Throw out old shoes.  Do not use artificial nails.  If you go to a nail salon, make sure you choose one that uses clean instruments.  Use antifungal foot powder on your feet and in your shoes. General instructions  Do not share personal items, such as towels or nail clippers.  Do not walk barefoot in shower rooms or locker rooms.  Wear rubber gloves if you are working with your hands in wet areas.  Keep all follow-up visits as told by your health care provider. This is important. Contact a health care provider if: Your infection is not getting better or it is getting worse after several months. Summary  A fungal nail infection is a common infection of the toenails or fingernails.  Treatment is not needed for mild infections. If you have significant nail changes, treatment may include taking medicine orally and applying medicine to your nails.  It can take a long time, usually up to a year, for the infection to go away. The  infection may also come back.  Take or apply over-the-counter and prescription medicines only as told by your health care provider.  Follow instructions for taking care of your nails to help prevent infection from coming back or spreading. This information is not intended to replace advice given to you by your health care provider. Make sure you discuss any questions you have with your health care provider. Document Revised: 06/07/2018 Document Reviewed: 07/21/2017 Elsevier Patient Education  [place].

## 2019-12-05 NOTE — Progress Notes (Signed)
Subjective:     Julia Elliott is a 39 y.o. female and is here for a comprehensive physical exam. Patient states she is doing well overall, keeping busy at work.  Since last office visit patient got engaged.  Her wedding is in 2 weeks in the [place] area of [place].  Pt is slightly anxious but ready to get married.  Patient has been going on a honeymoon to [place] x 3, [place] x 4 d, and the [place] x 3 d.  Pt had an appointment yesterday for yellow fever vaccine and typhoid.  She also received influenza vaccine 2 days ago.  Pt has concerns about malaria prophylaxis as in the past Malarone made her feel sick.    Surgery scheduled 01/14/2020 with OB/GYN for removal of right ovarian mass.  Patient endorses occasional abdominal discomfort, but has been able to manage.  Patient on continuous OCPs.    Patient also mentions whitish discoloration of toenails on bilateral feet.  Inquires about possible treatment options.    Patient has a form to be completed for her insurance company. Social History   Socioeconomic History  . Marital status: Single    Spouse name: Not on file  . Number of children: Not on file  . Years of education: Not on file  . Highest education level: Not on file  Occupational History  . Not on file  Tobacco Use  . Smoking status: Never Smoker  . Smokeless tobacco: Never Used  Vaping Use  . Vaping Use: Never used  Substance and Sexual Activity  . Alcohol use: Yes    Comment: once-twice a week  . Drug use: Never  . Sexual activity: Yes  Other Topics Concern  . Not on file  Social History Narrative  . Not on file   Social Determinants of Health   Financial Resource Strain:   . Difficulty of Paying Living Expenses: Not on file  Food Insecurity:   . Worried About Charity fundraiser in the Last Year: Not on file  . Ran Out of Food in the Last Year: Not on file  Transportation Needs:   . Lack of Transportation (Medical): Not on file  . Lack of Transportation  (Non-Medical): Not on file  Physical Activity:   . Days of Exercise per Week: Not on file  . Minutes of Exercise per Session: Not on file  Stress:   . Feeling of Stress : Not on file  Social Connections:   . Frequency of Communication with Friends and Family: Not on file  . Frequency of Social Gatherings with Friends and Family: Not on file  . Attends Religious Services: Not on file  . Active Member of Clubs or Organizations: Not on file  . Attends Archivist Meetings: Not on file  . Marital Status: Not on file  Intimate Partner Violence:   . Fear of Current or Ex-Partner: Not on file  . Emotionally Abused: Not on file  . Physically Abused: Not on file  . Sexually Abused: Not on file   Health Maintenance  Topic Date Due  . Hepatitis C Screening  Never done  . COVID-19 Vaccine (1) Never done  . HIV Screening  Never done  . PAP SMEAR-Modifier  11/27/2018  . TETANUS/TDAP  04/28/2019  . INFLUENZA VACCINE  09/29/2019    The following portions of the patient's history were reviewed and updated as appropriate: allergies, current medications, past family history, past medical history, past social history, past surgical history and  problem list.  Review of Systems Pertinent items noted in HPI and remainder of comprehensive ROS otherwise negative.   Objective:    BP 110/78 (BP Location: Left Arm, Patient Position: Sitting, Cuff Size: Normal)   Pulse 94   Temp 98 F (36.7 C) (Oral)   Ht _0  (1.6 m)   Wt 111 lb 6.4 oz (50.5 kg)   LMP 11/09/2019 (Exact Date)   SpO2 98%   BMI 19.73 kg/m  General appearance: alert, cooperative and no distress Head: Normocephalic, without obvious abnormality, atraumatic Eyes: conjunctivae/corneas clear. PERRL, EOM's intact. Fundi benign. Ears: normal TM's and external ear canals both ears Nose: Nares normal. Septum midline. Mucosa normal. No drainage or sinus tenderness. Throat: lips, mucosa, and tongue normal; teeth and gums  normal Neck: no adenopathy, no carotid bruit, no JVD, supple, symmetrical, trachea midline and thyroid not enlarged, symmetric, no tenderness/mass/nodules Lungs: clear to auscultation bilaterally Heart: regular rate and rhythm, S1, S2 normal, no murmur, click, rub or gallop Abdomen: soft, non-tender; bowel sounds normal; no masses,  no organomegaly Extremities: extremities normal, atraumatic, no cyanosis or edema Pulses: 2+ and symmetric Skin: Skin color, texture, turgor normal. No rashes or lesions bilateral toenails with areas of whiteish discoloration on bilateral feet.  No flaking or hypertrophic changes noted. Brittle. Lymph nodes: Cervical, supraclavicular, and axillary nodes normal. Neurologic: Alert and oriented X 3, normal strength and tone. Normal symmetric reflexes. Normal coordination and gait    Assessment:    Healthy female exam.      Plan:     Anticipatory guidance given including wearing seatbelts, smoke detectors in the home, increasing physical activity, increasing p.o. intake of water and vegetables. -We will obtain labs -Pap up-to-date.  Done 08/2019 with OB/GYN. -Given handout -Next CPE in 1 year -We will complete physical form for insurance company months with labs resolved See After Visit Summary for Counseling Recommendations    Onychomycosis -Discussed treatment options and likely time required to treat -We will obtain LFTs as considering starting Lamisil in the next few months  - Plan: CMP with eGFR([facility])  Hyperlipidemia, unspecified hyperlipidemia type  - Plan: Lipid panel, Hemoglobin A1c  Travel advice encounter  -Reviewed recommended immunizations and prophylaxis for travel to [place]-countries -Patient completed influenza vaccine, yellow fever vaccine, typhoid vaccine 12/04/2019 -Given prescription for malaria prophylaxis and Cipro in the event of severe diarrheal illness - Plan: ciprofloxacin (CIPRO) 500 MG tablet, mefloquine (LARIAM) 250 MG  tablet  Right tubo-ovarian mass -Stable -Robotic assisted R Salpingo-oophorectomy scheduled 01/14/2020 -Continue follow-up with OB/GYN  Follow-up as needed  Grier Mitts, MD

## 2019-12-06 LAB — CBC WITH DIFFERENTIAL/PLATELET
Absolute Monocytes: 161 cells/uL — ABNORMAL LOW (ref 200–950)
Basophils Absolute: 32 cells/uL (ref 0–200)
Basophils Relative: 0.7 %
Eosinophils Absolute: 69 cells/uL (ref 15–500)
Eosinophils Relative: 1.5 %
HCT: 40.5 % (ref 35.0–45.0)
Hemoglobin: 13.3 g/dL (ref 11.7–15.5)
Lymphs Abs: 1601 cells/uL (ref 850–3900)
MCH: 30.7 pg (ref 27.0–33.0)
MCHC: 32.8 g/dL (ref 32.0–36.0)
MCV: 93.5 fL (ref 80.0–100.0)
MPV: 11.3 fL (ref 7.5–12.5)
Monocytes Relative: 3.5 %
Neutro Abs: 2737 cells/uL (ref 1500–7800)
Neutrophils Relative %: 59.5 %
Platelets: 185 10*3/uL (ref 140–400)
RBC: 4.33 10*6/uL (ref 3.80–5.10)
RDW: 12.7 % (ref 11.0–15.0)
Total Lymphocyte: 34.8 %
WBC: 4.6 10*3/uL (ref 3.8–10.8)

## 2019-12-06 LAB — COMPLETE METABOLIC PANEL WITH GFR
AG Ratio: 1.6 (calc) (ref 1.0–2.5)
ALT: 9 U/L (ref 6–29)
AST: 11 U/L (ref 10–30)
Albumin: 4.5 g/dL (ref 3.6–5.1)
Alkaline phosphatase (APISO): 32 U/L (ref 31–125)
BUN: 13 mg/dL (ref 7–25)
CO2: 25 mmol/L (ref 20–32)
Calcium: 9.1 mg/dL (ref 8.6–10.2)
Chloride: 104 mmol/L (ref 98–110)
Creat: 0.62 mg/dL (ref 0.50–1.10)
GFR, Est African American: 134 mL/min/{1.73_m2} (ref >=60)
GFR, Est Non African American: 115 mL/min/{1.73_m2} (ref >=60)
Globulin: 2.9 g/dL (calc) (ref 1.9–3.7)
Glucose, Bld: 81 mg/dL (ref 65–99)
Potassium: 4.6 mmol/L (ref 3.5–5.3)
Sodium: 137 mmol/L (ref 135–146)
Total Bilirubin: 0.5 mg/dL (ref 0.2–1.2)
Total Protein: 7.4 g/dL (ref 6.1–8.1)

## 2019-12-06 LAB — HEMOGLOBIN A1C
Hgb A1c MFr Bld: 5.1 % of total Hgb (ref <5.7)
Mean Plasma Glucose: 100 (calc)
eAG (mmol/L): 5.5 (calc)

## 2019-12-06 LAB — SPECIMEN COMPROMISED

## 2019-12-06 LAB — LIPID PANEL
Cholesterol: 197 mg/dL (ref <200)
HDL: 48 mg/dL — ABNORMAL LOW (ref >=50)
LDL Cholesterol (Calc): 126 mg/dL (calc) — ABNORMAL HIGH
Non-HDL Cholesterol (Calc): 149 mg/dL (calc) — ABNORMAL HIGH (ref <130)
Total CHOL/HDL Ratio: 4.1 (calc) (ref <5.0)
Triglycerides: 115 mg/dL (ref <150)

## 2020-01-03 NOTE — Patient Instructions (Addendum)
DUE TO COVID-19 ONLY ONE VISITOR IS ALLOWED TO COME WITH YOU AND STAY IN THE WAITING ROOM ONLY DURING PRE OP AND PROCEDURE DAY OF SURGERY. THE 1 VISITOR  MAY VISIT WITH YOU AFTER SURGERY IN YOUR PRIVATE ROOM DURING VISITING HOURS ONLY!  YOU NEED TO HAVE A COVID 19 TEST ON__11/13____ @_9 :20_____, THIS TEST MUST BE DONE BEFORE SURGERY,  COVID TESTING SITE [address]  [id number], IT IS ON THE RIGHT GOING OUT [address] APPROXIMATELY  2 MINUTES PAST [employer] ON THE RIGHT.  ONCE YOUR COVID TEST IS COMPLETED,  PLEASE BEGIN THE QUARANTINE INSTRUCTIONS AS OUTLINED IN YOUR HANDOUT.                Julia Elliott    Your procedure is scheduled on: 01/14/20   Report to [facility] Main  Entrance   Report to Short Stay at 5:30 AM     Call this number if you have problems the morning of surgery [phone]    Remember: Do not eat food or drink liquids :After Midnight  . BRUSH YOUR TEETH MORNING OF SURGERY AND RINSE YOUR MOUTH OUT, NO CHEWING GUM CANDY OR MINTS.     Take these medicines the morning of surgery with A SIP OF WATER: None                                 You may not have any metal on your body including hair pins and              piercings  Do not wear jewelry, make-up, lotions, powders or perfumes, deodorant             Do not wear nail polish on your fingernails.  Do not shave  48 hours prior to surgery.              Do not bring valuables to the hospital. [place].  Contacts, dentures or bridgework may not be worn into surgery.       Patients discharged the day of surgery will not be allowed to drive home  . IF YOU ARE HAVING SURGERY AND GOING HOME THE SAME DAY, YOU MUST HAVE AN ADULT TO DRIVE YOU HOME AND BE WITH YOU FOR 24 HOURS  . YOU MAY GO HOME BY TAXI OR UBER OR ORTHERWISE, BUT AN ADULT MUST ACCOMPANY YOU HOME AND STAY WITH YOU FOR 24 HOURS.  Name and phone number of  your driver:  Special Instructions: N/A              Please read over the following fact sheets you were given: _____________________________________________________________________             [facility] - Preparing for Surgery Before surgery, you can play an important role.   Because skin is not sterile, your skin needs to be as free of germs as possible.   You can reduce the number of germs on your skin by washing with CHG (chlorahexidine gluconate) soap before surgery .  CHG is an antiseptic cleaner which kills germs and bonds with the skin to continue killing germs even after washing. Please DO NOT use if you have an allergy to CHG or antibacterial soaps.   If your skin becomes reddened/irritated stop using the CHG and inform  your nurse when you arrive at Short Stay. Do not shave (including legs and underarms) for at least 48 hours prior to the first CHG shower.   Please follow these instructions carefully:  1.  Shower with CHG Soap the night before surgery and the  morning of Surgery.  2.  If you choose to wash your hair, wash your hair first as usual with your  normal  shampoo.  3.  After you shampoo, rinse your hair and body thoroughly to remove the  shampoo.                                        4.  Use CHG as you would any other liquid soap.  You can apply chg directly  to the skin and wash                       Gently with a scrungie or clean washcloth.  5.  Apply the CHG Soap to your body ONLY FROM THE NECK DOWN.   Do not use on face/ open                           Wound or open sores. Avoid contact with eyes, ears mouth and genitals (private parts).                       Wash face,  Genitals (private parts) with your normal soap.             6.  Wash thoroughly, paying special attention to the area where your surgery  will be performed.  7.  Thoroughly rinse your body with warm water from the neck down.  8.  DO NOT shower/wash with your normal soap after using and rinsing  off  the CHG Soap.             9.  Pat yourself dry with a clean towel.            10.  Wear clean pajamas.            11.  Place clean sheets on your bed the night of your first shower and do not  sleep with pets. Day of Surgery : Do not apply any lotions/deodorants the morning of surgery.  Please wear clean clothes to the hospital/surgery center.  FAILURE TO FOLLOW THESE INSTRUCTIONS MAY RESULT IN THE CANCELLATION OF YOUR SURGERY PATIENT SIGNATURE_________________________________  NURSE SIGNATURE__________________________________  ________________________________________________________________________

## 2020-01-07 ENCOUNTER — Telehealth: Payer: Self-pay

## 2020-01-07 ENCOUNTER — Other Ambulatory Visit: Payer: Self-pay

## 2020-01-07 ENCOUNTER — Encounter (HOSPITAL_COMMUNITY): Payer: Self-pay

## 2020-01-07 ENCOUNTER — Encounter (HOSPITAL_COMMUNITY)
Admission: RE | Admit: 2020-01-07 | Discharge: 2020-01-07 | Disposition: A | Discharge status: Home / Self Care | Payer: 59 | Source: Ambulatory Visit | Attending: Gynecologic Oncology | Admitting: Gynecologic Oncology

## 2020-01-07 DIAGNOSIS — Z01818 Encounter for other preprocedural examination: Secondary | ICD-10-CM | POA: Insufficient documentation

## 2020-01-07 LAB — CBC
HCT: 41.7 % (ref 36.0–46.0)
Hemoglobin: 13.9 g/dL (ref 12.0–15.0)
MCH: 31 pg (ref 26.0–34.0)
MCHC: 33.3 g/dL (ref 30.0–36.0)
MCV: 92.9 fL (ref 80.0–100.0)
Platelets: 215 10*3/uL (ref 150–400)
RBC: 4.49 MIL/uL (ref 3.87–5.11)
RDW: 13 % (ref 11.5–15.5)
WBC: 5.7 10*3/uL (ref 4.0–10.5)
nRBC: 0 % (ref 0.0–0.2)

## 2020-01-07 NOTE — Telephone Encounter (Signed)
Told Julia Elliott that Dr. Denman George said to take the BCP through 01-13-20. She will talk with her the morning of surgery about directions for post op directions. Pt verbalized understanding.

## 2020-01-07 NOTE — Progress Notes (Signed)
COVID Vaccine Completed:yes Date COVID Vaccine completed:05/22/19 COVID vaccine manufacturer: Pfizer     PCP - Dr. Lockie Pares Cardiologist - none  Chest x-ray - no EKG - no Stress Test - no ECHO - no Cardiac Cath - no Pacemaker/ICD device last checked:NA  Sleep Study - no CPAP -   Fasting Blood Sugar - NA Checks Blood Sugar _____ times a day  Blood Thinner Instructions:NA Aspirin Instructions: Last Dose:  Anesthesia review:   Patient denies shortness of breath, fever, cough and chest pain at PAT appointment yes   Patient verbalized understanding of instructions that were given to them at the PAT appointment. Patient was also instructed that they will need to review over the PAT instructions again at home before surgery. Yes Pt has no SOB with any activity.

## 2020-01-11 ENCOUNTER — Other Ambulatory Visit (HOSPITAL_COMMUNITY)
Admission: RE | Admit: 2020-01-11 | Discharge: 2020-01-11 | Disposition: A | Discharge status: Home / Self Care | Payer: 59 | Source: Ambulatory Visit | Attending: Gynecologic Oncology | Admitting: Gynecologic Oncology

## 2020-01-11 DIAGNOSIS — Z20822 Contact with and (suspected) exposure to covid-19: Secondary | ICD-10-CM | POA: Insufficient documentation

## 2020-01-11 DIAGNOSIS — Z01812 Encounter for preprocedural laboratory examination: Secondary | ICD-10-CM | POA: Diagnosis not present

## 2020-01-11 LAB — SARS CORONAVIRUS 2 (TAT 6-24 HRS): SARS Coronavirus 2: NEGATIVE

## 2020-01-13 ENCOUNTER — Telehealth: Payer: Self-pay

## 2020-01-13 NOTE — Telephone Encounter (Signed)
[name] states that she understands her written pre op  Instructions and has no questions or concerns at this time.

## 2020-01-14 ENCOUNTER — Ambulatory Visit (HOSPITAL_COMMUNITY): Payer: 59 | Admitting: Certified Registered Nurse Anesthetist

## 2020-01-14 ENCOUNTER — Ambulatory Visit (HOSPITAL_COMMUNITY)
Admission: RE | Admit: 2020-01-14 | Discharge: 2020-01-14 | Disposition: A | Discharge status: Home / Self Care | Payer: 59 | Source: Other Acute Inpatient Hospital | Attending: Gynecologic Oncology | Admitting: Gynecologic Oncology

## 2020-01-14 ENCOUNTER — Encounter (HOSPITAL_COMMUNITY): Payer: Self-pay | Admitting: Gynecologic Oncology

## 2020-01-14 ENCOUNTER — Encounter (HOSPITAL_COMMUNITY)
Admission: RE | Disposition: A | Discharge status: Home / Self Care | Payer: Self-pay | Source: Other Acute Inpatient Hospital | Attending: Gynecologic Oncology

## 2020-01-14 DIAGNOSIS — N83202 Unspecified ovarian cyst, left side: Secondary | ICD-10-CM | POA: Diagnosis present

## 2020-01-14 DIAGNOSIS — R971 Elevated cancer antigen 125 [CA 125]: Secondary | ICD-10-CM | POA: Diagnosis present

## 2020-01-14 DIAGNOSIS — N801 Endometriosis of ovary: Secondary | ICD-10-CM | POA: Diagnosis not present

## 2020-01-14 DIAGNOSIS — N83201 Unspecified ovarian cyst, right side: Secondary | ICD-10-CM | POA: Diagnosis not present

## 2020-01-14 DIAGNOSIS — N840 Polyp of corpus uteri: Secondary | ICD-10-CM | POA: Insufficient documentation

## 2020-01-14 DIAGNOSIS — N838 Other noninflammatory disorders of ovary, fallopian tube and broad ligament: Secondary | ICD-10-CM | POA: Diagnosis present

## 2020-01-14 DIAGNOSIS — N800 Endometriosis of uterus: Secondary | ICD-10-CM | POA: Diagnosis not present

## 2020-01-14 HISTORY — PX: ROBOTIC ASSISTED SALPINGO OOPHERECTOMY: SHX6082

## 2020-01-14 HISTORY — PX: HYSTEROSCOPY WITH D & C: SHX1775

## 2020-01-14 LAB — PREGNANCY, URINE: Preg Test, Ur: NEGATIVE

## 2020-01-14 SURGERY — SALPINGO-OOPHORECTOMY, ROBOT-ASSISTED
Anesthesia: General

## 2020-01-14 MED ORDER — OXYCODONE HCL 5 MG/5ML PO SOLN: 5.0000 mg | Freq: Once | ORAL | Status: DC | PRN

## 2020-01-14 MED ORDER — EPHEDRINE 5 MG/ML INJ
INTRAVENOUS | Status: AC
  Filled 2020-01-14: qty 10

## 2020-01-14 MED ORDER — LIDOCAINE 2% (20 MG/ML) 5 ML SYRINGE
INTRAMUSCULAR | Status: DC | PRN
  Administered 2020-01-14: 1.5 mg/kg/h via INTRAVENOUS

## 2020-01-14 MED ORDER — ROCURONIUM BROMIDE 10 MG/ML (PF) SYRINGE
PREFILLED_SYRINGE | INTRAVENOUS | Status: DC | PRN
  Administered 2020-01-14: 60 mg via INTRAVENOUS

## 2020-01-14 MED ORDER — HYDROMORPHONE HCL 1 MG/ML IJ SOLN
0.2500 mg | INTRAMUSCULAR | Status: DC | PRN
  Administered 2020-01-14 (×2): 0.5 mg via INTRAVENOUS

## 2020-01-14 MED ORDER — PHENYLEPHRINE HCL (PRESSORS) 10 MG/ML IV SOLN
INTRAVENOUS | Status: AC
  Filled 2020-01-14: qty 1

## 2020-01-14 MED ORDER — STERILE WATER FOR IRRIGATION IR SOLN
Status: DC | PRN
  Administered 2020-01-14: 1000 mL

## 2020-01-14 MED ORDER — HYDROMORPHONE HCL 1 MG/ML IJ SOLN
INTRAMUSCULAR | Status: AC
  Filled 2020-01-14: qty 1

## 2020-01-14 MED ORDER — DEXAMETHASONE SODIUM PHOSPHATE 10 MG/ML IJ SOLN
INTRAMUSCULAR | Status: AC
  Filled 2020-01-14: qty 1

## 2020-01-14 MED ORDER — SODIUM CHLORIDE 0.9% FLUSH: 3.0000 mL | Freq: Two times a day (BID) | INTRAVENOUS | Status: DC

## 2020-01-14 MED ORDER — SCOPOLAMINE 1 MG/3DAYS TD PT72
1.0000 | MEDICATED_PATCH | TRANSDERMAL | Status: DC
  Administered 2020-01-14: 1.5 mg via TRANSDERMAL
  Filled 2020-01-14: qty 1

## 2020-01-14 MED ORDER — ORAL CARE MOUTH RINSE: 15.0000 mL | Freq: Once | OROMUCOSAL | Status: AC

## 2020-01-14 MED ORDER — SUGAMMADEX SODIUM 200 MG/2ML IV SOLN
INTRAVENOUS | Status: DC | PRN
  Administered 2020-01-14: 150 mg via INTRAVENOUS

## 2020-01-14 MED ORDER — OXYCODONE HCL 5 MG PO CAPS: 5.0000 mg | ORAL_CAPSULE | Freq: Four times a day (QID) | ORAL | 0 refills | Status: DC | PRN

## 2020-01-14 MED ORDER — BUPIVACAINE HCL 0.25 % IJ SOLN
INTRAMUSCULAR | Status: AC
  Filled 2020-01-14: qty 1

## 2020-01-14 MED ORDER — ONDANSETRON HCL 4 MG/2ML IJ SOLN
INTRAMUSCULAR | Status: DC | PRN
  Administered 2020-01-14: 4 mg via INTRAVENOUS

## 2020-01-14 MED ORDER — KETAMINE HCL 10 MG/ML IJ SOLN
INTRAMUSCULAR | Status: DC | PRN
  Administered 2020-01-14: 20 mg via INTRAVENOUS

## 2020-01-14 MED ORDER — CEFAZOLIN SODIUM-DEXTROSE 2-4 GM/100ML-% IV SOLN
2.0000 g | INTRAVENOUS | Status: AC
  Administered 2020-01-14: 2 g via INTRAVENOUS
  Filled 2020-01-14: qty 100

## 2020-01-14 MED ORDER — MIDAZOLAM HCL 2 MG/2ML IJ SOLN
INTRAMUSCULAR | Status: AC
  Filled 2020-01-14: qty 2

## 2020-01-14 MED ORDER — SODIUM CHLORIDE 0.9 % IR SOLN
Status: DC | PRN
  Administered 2020-01-14: 3000 mL

## 2020-01-14 MED ORDER — MIDAZOLAM HCL 5 MG/5ML IJ SOLN
INTRAMUSCULAR | Status: DC | PRN
  Administered 2020-01-14 (×2): 1 mg via INTRAVENOUS

## 2020-01-14 MED ORDER — KETAMINE HCL 10 MG/ML IJ SOLN
INTRAMUSCULAR | Status: AC
  Filled 2020-01-14: qty 1

## 2020-01-14 MED ORDER — ACETAMINOPHEN 500 MG PO TABS
1000.0000 mg | ORAL_TABLET | ORAL | Status: AC
  Administered 2020-01-14: 1000 mg via ORAL
  Filled 2020-01-14: qty 2

## 2020-01-14 MED ORDER — PROMETHAZINE HCL 25 MG/ML IJ SOLN: 6.2500 mg | INTRAMUSCULAR | Status: DC | PRN

## 2020-01-14 MED ORDER — LACTATED RINGERS IV SOLN: INTRAVENOUS | Status: DC

## 2020-01-14 MED ORDER — PHENYLEPHRINE 40 MCG/ML (10ML) SYRINGE FOR IV PUSH (FOR BLOOD PRESSURE SUPPORT)
PREFILLED_SYRINGE | INTRAVENOUS | Status: AC
  Filled 2020-01-14: qty 10

## 2020-01-14 MED ORDER — FENTANYL CITRATE (PF) 250 MCG/5ML IJ SOLN
INTRAMUSCULAR | Status: AC
  Filled 2020-01-14: qty 5

## 2020-01-14 MED ORDER — DEXAMETHASONE SODIUM PHOSPHATE 4 MG/ML IJ SOLN: 4.0000 mg | INTRAMUSCULAR | Status: DC

## 2020-01-14 MED ORDER — ROCURONIUM BROMIDE 10 MG/ML (PF) SYRINGE
PREFILLED_SYRINGE | INTRAVENOUS | Status: AC
  Filled 2020-01-14: qty 10

## 2020-01-14 MED ORDER — ONDANSETRON HCL 4 MG/2ML IJ SOLN
INTRAMUSCULAR | Status: AC
  Administered 2020-01-14: 4 mg
  Filled 2020-01-14: qty 2

## 2020-01-14 MED ORDER — PROPOFOL 10 MG/ML IV BOLUS
INTRAVENOUS | Status: DC | PRN
  Administered 2020-01-14: 150 mg via INTRAVENOUS

## 2020-01-14 MED ORDER — LIDOCAINE 2% (20 MG/ML) 5 ML SYRINGE
INTRAMUSCULAR | Status: AC
  Filled 2020-01-14: qty 5

## 2020-01-14 MED ORDER — DEXAMETHASONE SODIUM PHOSPHATE 10 MG/ML IJ SOLN
INTRAMUSCULAR | Status: DC | PRN
  Administered 2020-01-14: 5 mg via INTRAVENOUS

## 2020-01-14 MED ORDER — CHLORHEXIDINE GLUCONATE 0.12 % MT SOLN
15.0000 mL | Freq: Once | OROMUCOSAL | Status: AC
  Administered 2020-01-14: 15 mL via OROMUCOSAL

## 2020-01-14 MED ORDER — LIDOCAINE HCL 2 % IJ SOLN
INTRAMUSCULAR | Status: AC
  Filled 2020-01-14: qty 20

## 2020-01-14 MED ORDER — FENTANYL CITRATE (PF) 250 MCG/5ML IJ SOLN
INTRAMUSCULAR | Status: DC | PRN
  Administered 2020-01-14 (×3): 50 ug via INTRAVENOUS
  Administered 2020-01-14: 100 ug via INTRAVENOUS

## 2020-01-14 MED ORDER — PROPOFOL 10 MG/ML IV BOLUS
INTRAVENOUS | Status: AC
  Filled 2020-01-14: qty 20

## 2020-01-14 MED ORDER — ONDANSETRON HCL 4 MG/2ML IJ SOLN
INTRAMUSCULAR | Status: AC
  Filled 2020-01-14: qty 2

## 2020-01-14 MED ORDER — GABAPENTIN 300 MG PO CAPS
300.0000 mg | ORAL_CAPSULE | ORAL | Status: AC
  Administered 2020-01-14: 300 mg via ORAL
  Filled 2020-01-14: qty 1

## 2020-01-14 MED ORDER — OXYCODONE HCL 5 MG PO TABS: 5.0000 mg | ORAL_TABLET | Freq: Once | ORAL | Status: DC | PRN

## 2020-01-14 MED ORDER — BUPIVACAINE HCL 0.25 % IJ SOLN
INTRAMUSCULAR | Status: DC | PRN
  Administered 2020-01-14: 20 mL

## 2020-01-14 MED ORDER — LIDOCAINE 2% (20 MG/ML) 5 ML SYRINGE
INTRAMUSCULAR | Status: DC | PRN
  Administered 2020-01-14: 5 mg via INTRAVENOUS
  Administered 2020-01-14: 60 mg via INTRAVENOUS

## 2020-01-14 MED ORDER — LACTATED RINGERS IR SOLN
Status: DC | PRN
  Administered 2020-01-14: 1000 mL

## 2020-01-14 MED ORDER — CELECOXIB 200 MG PO CAPS
400.0000 mg | ORAL_CAPSULE | ORAL | Status: AC
  Administered 2020-01-14: 400 mg via ORAL
  Filled 2020-01-14: qty 2

## 2020-01-14 SURGICAL SUPPLY — 68 items
APPLICATOR SURGIFLO ENDO (HEMOSTASIS) IMPLANT
BACTOSHIELD CHG 4% 4OZ (MISCELLANEOUS) ×1
BAG LAPAROSCOPIC 12 15 PORT 16 (BASKET) IMPLANT
BAG RETRIEVAL 12/15 (BASKET)
BLADE SURG SZ10 CARB STEEL (BLADE) IMPLANT
COVER BACK TABLE 60X90IN (DRAPES) ×2 IMPLANT
COVER TIP SHEARS 8 DVNC (MISCELLANEOUS) ×1 IMPLANT
COVER TIP SHEARS 8MM DA VINCI (MISCELLANEOUS) ×1
COVER WAND RF STERILE (DRAPES) IMPLANT
DECANTER SPIKE VIAL GLASS SM (MISCELLANEOUS) IMPLANT
DERMABOND ADVANCED (GAUZE/BANDAGES/DRESSINGS) ×1
DERMABOND ADVANCED .7 DNX12 (GAUZE/BANDAGES/DRESSINGS) ×1 IMPLANT
DRAPE ARM DVNC X/XI (DISPOSABLE) ×4 IMPLANT
DRAPE COLUMN DVNC XI (DISPOSABLE) ×1 IMPLANT
DRAPE DA VINCI XI ARM (DISPOSABLE) ×4
DRAPE DA VINCI XI COLUMN (DISPOSABLE) ×1
DRAPE SHEET LG 3/4 BI-LAMINATE (DRAPES) ×2 IMPLANT
DRAPE SURG IRRIG POUCH 19X23 (DRAPES) ×2 IMPLANT
DRSG OPSITE POSTOP 4X6 (GAUZE/BANDAGES/DRESSINGS) IMPLANT
DRSG OPSITE POSTOP 4X8 (GAUZE/BANDAGES/DRESSINGS) IMPLANT
DRSG TELFA 3X8 NADH (GAUZE/BANDAGES/DRESSINGS) ×2 IMPLANT
ELECT BLADE TIP CTD 4 INCH (ELECTRODE) ×2 IMPLANT
ELECT PENCIL ROCKER SW 15FT (MISCELLANEOUS) ×2 IMPLANT
ELECT REM PT RETURN 15FT ADLT (MISCELLANEOUS) ×2 IMPLANT
GLOVE BIO SURGEON STRL SZ 6 (GLOVE) ×8 IMPLANT
GLOVE BIO SURGEON STRL SZ 6.5 (GLOVE) ×4 IMPLANT
GOWN STRL REUS W/ TWL LRG LVL3 (GOWN DISPOSABLE) ×4 IMPLANT
GOWN STRL REUS W/TWL LRG LVL3 (GOWN DISPOSABLE) ×4
HOLDER FOLEY CATH W/STRAP (MISCELLANEOUS) IMPLANT
IRRIG SUCT STRYKERFLOW 2 WTIP (MISCELLANEOUS) ×2
IRRIGATION SUCT STRKRFLW 2 WTP (MISCELLANEOUS) ×1 IMPLANT
KIT PROCEDURE DA VINCI SI (MISCELLANEOUS)
KIT PROCEDURE DVNC SI (MISCELLANEOUS) IMPLANT
KIT PROCEDURE FLUENT (KITS) ×2 IMPLANT
KIT TURNOVER KIT A (KITS) IMPLANT
MANIPULATOR UTERINE 4.5 ZUMI (MISCELLANEOUS) ×2 IMPLANT
NEEDLE HYPO 22GX1.5 SAFETY (NEEDLE) ×2 IMPLANT
NEEDLE SPNL 18GX3.5 QUINCKE PK (NEEDLE) IMPLANT
OBTURATOR OPTICAL STANDARD 8MM (TROCAR) ×1
OBTURATOR OPTICAL STND 8 DVNC (TROCAR) ×1
OBTURATOR OPTICALSTD 8 DVNC (TROCAR) ×1 IMPLANT
PACK ROBOT GYN CUSTOM WL (TRAY / TRAY PROCEDURE) ×2 IMPLANT
PAD POSITIONING PINK XL (MISCELLANEOUS) ×2 IMPLANT
PORT ACCESS TROCAR AIRSEAL 12 (TROCAR) ×1 IMPLANT
PORT ACCESS TROCAR AIRSEAL 5M (TROCAR) ×1
POUCH SPECIMEN RETRIEVAL 10MM (ENDOMECHANICALS) IMPLANT
SCRUB CHG 4% DYNA-HEX 4OZ (MISCELLANEOUS) ×1 IMPLANT
SEAL CANN UNIV 5-8 DVNC XI (MISCELLANEOUS) ×3 IMPLANT
SEAL XI 5MM-8MM UNIVERSAL (MISCELLANEOUS) ×3
SET TRI-LUMEN FLTR TB AIRSEAL (TUBING) ×2 IMPLANT
SPONGE LAP 18X18 RF (DISPOSABLE) IMPLANT
SURGIFLO W/THROMBIN 8M KIT (HEMOSTASIS) IMPLANT
SUT MNCRL AB 4-0 PS2 18 (SUTURE) IMPLANT
SUT PDS AB 1 TP1 96 (SUTURE) IMPLANT
SUT VIC AB 0 CT1 27 (SUTURE)
SUT VIC AB 0 CT1 27XBRD ANTBC (SUTURE) IMPLANT
SUT VIC AB 2-0 CT1 27 (SUTURE)
SUT VIC AB 2-0 CT1 TAPERPNT 27 (SUTURE) IMPLANT
SUT VIC AB 4-0 PS2 18 (SUTURE) ×4 IMPLANT
SYR 10ML LL (SYRINGE) IMPLANT
SYR 20ML LL LF (SYRINGE) IMPLANT
SYR 50ML LL SCALE MARK (SYRINGE) IMPLANT
TOWEL OR NON WOVEN STRL DISP B (DISPOSABLE) ×2 IMPLANT
TRAP SPECIMEN MUCUS 40CC (MISCELLANEOUS) IMPLANT
TRAY FOLEY MTR SLVR 16FR STAT (SET/KITS/TRAYS/PACK) ×2 IMPLANT
TROCAR XCEL NON-BLD 5MMX100MML (ENDOMECHANICALS) IMPLANT
UNDERPAD 30X36 HEAVY ABSORB (UNDERPADS AND DIAPERS) ×2 IMPLANT
WATER STERILE IRR 1000ML POUR (IV SOLUTION) ×2 IMPLANT

## 2020-01-14 NOTE — Op Note (Signed)
OPERATIVE NOTE  Date: 01/14/20  Preoperative Diagnosis: right ovarian mass, endometrial polyp, elevated CA 125   Postoperative Diagnosis:  Stage IV endometriosis, right and left endometriomas,   Procedure(s) Performed: Robotic-assisted laparoscopic right salpingo-oophorectomy, left ovarian cystectomy, lysis of adhesions, hysteroscopy and D&C.  Surgeon: Everitt Amber, M.D.  Assistant Surgeon: Lahoma Crocker M.D. (an MD assistant was necessary for tissue manipulation, management of robotic instrumentation, retraction and positioning due to the complexity of the case and hospital policies).   Anesthesia: Gen. endotracheal.  Specimens: right tube and ovary, left ovarian cyst, washings, endometrial curettings.   Estimated Blood Loss: 20 mL. Blood Replacement: None  Complications: none  Indication for Procedure:  The patient had pelvic pain and a 7cm right ovarian mass on [place] and MRI with elevated CA 125, there was an endometrial polyp on [place] and menorrhagia.   Operative Findings: stenotic cervix, false passage in myometrium with hysteroscope, stage IV endometriosis with ovaries with bilateral endometriomas (right side was 7cm, left side was 3cm), with ovaries kissing posteriorly, posterior cul de sac obliterated with rectum adherent to ovaries and posterior uterus. Chocolate colored cyst fluid and right ovary consistent with endometrioma on frozen section. Small subserosal uterine fibroids. At completion of surgery the anatomy was restored to normal. See Epic for image at completion of procedure.   Procedure: The patient's taken to the operating room and placed under general endotracheal anesthesia testing difficulty. She is placed in a dorsolithotomy position and cervical acromial pad was placed. The arms were tucked with care taken to pad the olecranon process. And prepped and draped in usual sterile fashion.   The speculum was inserted into the vagina and the cervix was visualized and  grasped with a tenaculum. The uterine sound was attempted to be inserted to the uterine fundus, however the cervix was stenotic and would not permit entry of the sound into the uterine fundus through the internal os.  An attempt was made to dilate the cervix with [facility] dilators.  However stenotic cervix did not yield.  The hysteroscope was inserted into the cervical os and an attempt was made to enter the endometrial cavity under direct visualization with the hysteroscope.  A clear passageway into the endometrial cavity could not be determined.  There was some development of a false passage with the hysteroscope however no perforation.  The hysteroscopic portion of the procedure was aborted due to lack of success of visualization of the endometrial cavity.  There is a deficit measured of 800 cc, however it was noted that at least 500 cc was present on the floor of the operating room due to leakage from the capsular bag.  A uterine manipulator (zumi) was placed vaginally and appeared to enter into an endometrial cavity of sorts which had not been able to be entered hysteroscopically. A 55mm incision was made in the left upper quadrant palmer's point and a 5 mm Optiview trocar used to enter the abdomen under direct visualization. With entry into the abdomen and then maintenance of 15 mm of mercury the patient was placed in Trendelenburg position. An incision was made in the umbilicus and a 33mm trochar was placed through this site. Two incisions were made lateral to the umbilical incision in the left and right abdomen measuring 6mm. These incisions were made approximately 10 cm lateral to the umbilical incision. 8 mm robotic trochars were inserted.  An additional incision in the left lateral abdomen was made measuring 8 mm.  The robot was docked.  The abdomen was  inspected as was the pelvis.  Pelvic washings were obtained.  Sharp adhesiolysis was performed to separate the rectum from its adhesions to the ovaries  bilaterally and the posterior uterus.  The ovaries bilaterally was separated from their dense adhesions to the uterosacral ligaments and ovarian fossa bilaterally.  In doing so there was unavoidable rupture of the bilateral ovarian cyst due to the densely adherent nature to the ovarian fossa.  Thick chocolate colored fluid was admitted from the ovarian cysts and aspirated.    An incision was made on the right pelvic side wall peritoneum parallel to the IP ligament and the retroperitoneal space entered. The right ureter was identified and the para-rectal space was developed. A window was created in the right broad ligament above the ureter. The right infundibulopelvic vessels were skeletonized cauterized and transected. The utero-ovarian ligaments similarly were cauterized and transected. Specimen was placed in an Endo Catch bag.  In a similar manner the left peritoneum and the side wall was incised, and the retroperitoneal space entered. The left ureter was identified and the left pararectal space was developed. The utero-ovarian ligament was skeletonized cauterized and transected. The left utero-ovarian ligaments were cauterized and transected in the left adnexa was placed in an Endo Catch bag.   The left ovary was then carefully inspected and an endometriotic cyst was identified in the left ovary.  The ovarian cortex was scored with the scissors overlying the cyst.  The cyst wall was grasped and dissected free from the surrounding ovarian cortex.  Hemostasis was obtained with the monopolar and bipolar electrosurgical devices.  The specimen was sent for permanent pathology.  The abdomen was copiously irrigated and drained and all operative sites inspected and hemostasis was assured  The robot was undocked.  The right tube and ovary were delivered within the Endo Catch bag from the left upper quadrant port incision and sent for frozen section which was consistent with a hemorrhagic cyst or  endometrioma.  The ports were all remove. The fascial closure at the umbilical incision and left upper quadrant port was made with 0 Vicryl.  All incisions were closed with a running subcuticular Monocryl suture. Dermabond was applied.  At the completion of the procedure the cervix was again grasped with a tenaculum and the uterine sound was again introduced into what felt consistent with an endometrial cavity.  A blind endometrial curette was performed of the endometrial cavity.  The tissue was sent for permanent pathology.  The fundus was palpably intact at the end of the curette throughout the procedure. Sponge, lap and needle counts were correct x 3.    The patient had sequential compression devices for VTE prophylaxis.         Disposition: PACU          Condition: stable  Donaciano Eva, MD

## 2020-01-14 NOTE — Anesthesia Preprocedure Evaluation (Signed)
Anesthesia Evaluation  Patient identified by MRN, date of birth, ID band Patient awake    Reviewed: Allergy & Precautions, NPO status , Patient's Chart, lab work & pertinent test results  Airway Mallampati: II  TM Distance: >3 FB Neck ROM: Full    Dental no notable dental hx.    Pulmonary neg pulmonary ROS,    Pulmonary exam normal breath sounds clear to auscultation       Cardiovascular negative cardio ROS Normal cardiovascular exam Rhythm:Regular Rate:Normal     Neuro/Psych negative neurological ROS  negative psych ROS   GI/Hepatic negative GI ROS, Neg liver ROS,   Endo/Other  negative endocrine ROS  Renal/GU negative Renal ROS  negative genitourinary   Musculoskeletal negative musculoskeletal ROS (+)   Abdominal   Peds negative pediatric ROS (+)  Hematology negative hematology ROS (+)   Anesthesia Other Findings   Reproductive/Obstetrics negative OB ROS                             Anesthesia Physical Anesthesia Plan  ASA: I  Anesthesia Plan: General   Post-op Pain Management:    Induction: Intravenous  PONV Risk Score and Plan: 3 and Ondansetron, Dexamethasone, Treatment may vary due to age or medical condition, Scopolamine patch - Pre-op and Midazolam  Airway Management Planned: Oral ETT  Additional Equipment:   Intra-op Plan:   Post-operative Plan: Extubation in OR  Informed Consent: I have reviewed the patients History and Physical, chart, labs and discussed the procedure including the risks, benefits and alternatives for the proposed anesthesia with the patient or authorized representative who has indicated his/her understanding and acceptance.     Dental advisory given  Plan Discussed with: CRNA and Surgeon  Anesthesia Plan Comments:         Anesthesia Quick Evaluation

## 2020-01-14 NOTE — Transfer of Care (Signed)
Immediate Anesthesia Transfer of Care Note  Patient: TACEY DIMAGGIO  Procedure(s) Performed: XI ROBOTIC ASSISTED RIGHT SALPINGO-OOPHORECTOMY, LEFT OVARIAN CYSTECTOMY, LYSIS OF ADHESIONS (N/A ) DILATATION AND CURETTAGE /HYSTEROSCOPY (N/A )  Patient Location: PACU  Anesthesia Type:General  Level of Consciousness: awake, alert  and oriented  Airway & Oxygen Therapy: Patient Spontanous Breathing and Patient connected to face mask oxygen  Post-op Assessment: Report given to RN and Post -op Vital signs reviewed and stable  Post vital signs: Reviewed and stable  Last Vitals:  Vitals Value Taken Time  BP 134/81 01/14/20 0936  Temp    Pulse 94 01/14/20 0938  Resp 11 01/14/20 0938  SpO2 100 % 01/14/20 0938  Vitals shown include unvalidated device data.  Last Pain:  Vitals:   01/14/20 0628  TempSrc: Oral  PainSc:          Complications: No complications documented.

## 2020-01-14 NOTE — Anesthesia Procedure Notes (Signed)
Procedure Name: Intubation Date/Time: 01/14/2020 7:52 AM Performed by: Maxwell Caul, CRNA Pre-anesthesia Checklist: Patient identified, Emergency Drugs available, Suction available and Patient being monitored Patient Re-evaluated:Patient Re-evaluated prior to induction Oxygen Delivery Method: Circle system utilized Preoxygenation: Pre-oxygenation with 100% oxygen Induction Type: IV induction Ventilation: Mask ventilation without difficulty Laryngoscope Size: Mac and 3 Grade View: Grade I Tube type: Oral Tube size: 7.0 mm Number of attempts: 1 Airway Equipment and Method: Stylet Placement Confirmation: ETT inserted through vocal cords under direct vision,  positive ETCO2 and breath sounds checked- equal and bilateral Secured at: 21 cm Tube secured with: Tape Dental Injury: Teeth and Oropharynx as per pre-operative assessment

## 2020-01-14 NOTE — H&P (Signed)
H&P Note: Gyn-Onc  Consult was requested by Dr. Ronita Hipps for the evaluation of Julia Elliott 39 y.o. female  CC:  Right ovarian mass, endometrial polyp, elevated CA 125  Assessment/Plan:  [name]  is a 39 y.o.  year old with a complex right ovarian cystic mass and elevated CA 125. I favor this being either an endometrioma or dermoid given the constellation of symptoms.  MRI of the pelvis shows features most reassuring for endometrioma.  I favor proceeding with robotic assisted USO, fertility sparing staging if malignancy is identified.   Due to her concurrent endometrial polyp, I recommend hysteroscopy with D&C and polypectomy as a concurrent procedure to ameliorate her bleeding symptoms.   I discussed surgical risks including  bleeding, infection, damage to internal organs (such as bladder,ureters, bowels), blood clot, reoperation and rehospitalization. I discussed anticipated hospital stay and course of recovery/restrictions.  HPI: [name] is a 39 year old P0 who was seen in consultation at the request of Dr Ronita Hipps for evaluation of a right complex ovarian cyst and elevated CA 125.  The patient had recently relocated to [facility] and needed to see gyn care. She had been experiencing cyclical pelvic pain during menses for approximately 6 months and heavy menstrual cycles.  She was seen by Dr Ronita Hipps and a TVUS was performed on 10/16/19 which showed a 7.7x5.5x7.3cm right ovary with an echogenic complex cyst and hypoechoic septation. There was no internal blood flow. The appearance was most consistent with a dermoid. A 1cm endometrial polyp was also seen from anterior fundus. A simple left ovarian cyst was also seen. CA125 was drawn on 11/01/19 and this was elevated to 216. Her CEA was normal at 1.4.  Her medical history is unremarkable.  Her surgical history is unremarkable (no abdominal surgery).  Her gynecologic history is remarkable for menorrhagia . She does  not desire future fertility, but values ovarian preservation (hormonal).  Her family cancer history is unremarkable for breast/ovarian cancer.  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 She lives with her new husband, and has recently travelled for a 3 week vacation to [place], [place], [place] etc.   Current Meds:  Outpatient Encounter Medications as of 11/07/2019  Medication Sig  . Multiple Vitamin (MULTIVITAMIN ADULT PO) Take 1 tablet by mouth daily.    No facility-administered encounter medications on file as of 11/07/2019.    Allergy: No Known Allergies  Social Hx:   Social History   Socioeconomic History  . Marital status: Married    Spouse name: Not on file  . Number of children: Not on file  . Years of education: Not on file  . Highest education level: Not on file  Occupational History  . Not on file  Tobacco Use  . Smoking status: Never Smoker  . Smokeless tobacco: Never Used  Vaping Use  . Vaping Use: Never used  Substance and Sexual Activity  . Alcohol use: Yes    Comment: once-twice a week  . Drug use: Never  . Sexual activity: Yes  Other Topics Concern  . Not on file  Social History Narrative  . Not on file   Social Determinants of Health   Financial Resource Strain:   . Difficulty of Paying Living Expenses: Not on file  Food Insecurity:   . Worried About Charity fundraiser in the Last Year: Not on file  . Ran Out of Food in the Last Year: Not on file  Transportation Needs:   .  Lack of Transportation (Medical): Not on file  . Lack of Transportation (Non-Medical): Not on file  Physical Activity:   . Days of Exercise per Week: Not on file  . Minutes of Exercise per Session: Not on file  Stress:   . Feeling of Stress : Not on file  Social Connections:   . Frequency of Communication with Friends and Family: Not on file  . Frequency of Social Gatherings with Friends and Family: Not on file  . Attends Religious Services: Not on file  . Active Member of  Clubs or Organizations: Not on file  . Attends Archivist Meetings: Not on file  . Marital Status: Not on file  Intimate Partner Violence:   . Fear of Current or Ex-Partner: Not on file  . Emotionally Abused: Not on file  . Physically Abused: Not on file  . Sexually Abused: Not on file    Past Surgical Hx:  Past Surgical History:  Procedure Laterality Date  . CYST EXCISION N/A    neck  . WISDOM TOOTH EXTRACTION      Past Medical Hx:  Past Medical History:  Diagnosis Date  . Chicken pox     Past Gynecological History:  See HPI No LMP recorded.  Family Hx:  Family History  Problem Relation Age of Onset  . Hypertension Father   . Hyperlipidemia Father     Review of Systems:  Constitutional  Feels well,   ENT Normal appearing ears and nares bilaterally Skin/Breast  No rash, sores, jaundice, itching, dryness Cardiovascular  No chest pain, shortness of breath, or edema  Pulmonary  No cough or wheeze.  Gastro Intestinal  No nausea, vomitting, or diarrhoea. No bright red blood per rectum, no abdominal pain, change in bowel movement, or constipation.  Genito Urinary  No frequency, urgency, dysuria, + dysmenorrhea Musculo Skeletal  No myalgia, arthralgia, joint swelling or pain  Neurologic  No weakness, numbness, change in gait,  Psychology  No depression, anxiety, insomnia.   Vitals:  Blood pressure (!) 136/93, pulse (!) 116, temperature 98.4 F (36.9 C), temperature source Oral, resp. rate 15, weight 116 lb (52.6 kg), SpO2 100 %.  Physical Exam: WD in NAD Neck  Supple NROM, without any enlargements.  Lymph Node Survey No cervical supraclavicular or inguinal adenopathy Cardiovascular  Pulse normal rate, regularity and rhythm. S1 and S2 normal.  Lungs  Clear to auscultation bilateraly, without wheezes/crackles/rhonchi. Good air movement.  Skin  No rash/lesions/breakdown  Psychiatry  Alert and oriented to person, place, and time  Abdomen   Normoactive bowel sounds, abdomen soft, non-tender and thin without evidence of hernia.  Back No CVA tenderness Genito Urinary  Vulva/vagina: Normal external female genitalia.  No lesions. No discharge or bleeding.  Bladder/urethra:  No lesions or masses, well supported bladder  Vagina: normal  Cervix: Normal appearing, no lesions.  Uterus: retroverted Small, mobile, no parametrial involvement or nodularity.  Adnexa: no discretely palpable masses, however fullness appreciated on right. Rectal  Good tone, no masses no cul de sac nodularity.  Extremities  No bilateral cyanosis, clubbing or edema.  Thereasa Solo, MD  01/14/2020, 7:07 AM

## 2020-01-14 NOTE — Discharge Instructions (Signed)
Return to work: 3 days to 1 week.  Activity: 1. Be up and out of the bed during the day.  Take a nap if needed.  You may walk up steps but be careful and use the hand rail.  Stair climbing will tire you more than you think, you may need to stop part way and rest.   2. No lifting or straining for 2 weeks.  3. No driving for 1 weeks.  Do Not drive if you are taking narcotic pain medicine.  4. Shower daily.  Use soap and water on your incision and pat dry; don't rub.   5. No sexual activity and nothing in the vagina for 4 weeks.  Medications:  - Take ibuprofen and tylenol first line for pain control. Take these regularly (every 6 hours) to decrease the build up of pain.  - If necessary, for severe pain not relieved by ibuprofen, contact Dr Serita Grit office and you will be prescribed percocet.  - While taking percocet you should take sennakot every night to reduce the likelihood of constipation. If this causes diarrhea, stop its use.  Diet: 1. Low sodium Heart Healthy Diet is recommended.  2. It is safe to use a laxative if you have difficulty moving your bowels.   Wound Care: 1. Keep clean and dry.  Shower daily.  Reasons to call the Doctor:   Fever - Oral temperature greater than 100.4 degrees Fahrenheit  Foul-smelling vaginal discharge  Difficulty urinating  Nausea and vomiting  Increased pain at the site of the incision that is unrelieved with pain medicine.  Difficulty breathing with or without chest pain  New calf pain especially if only on one side  Sudden, continuing increased vaginal bleeding with or without clots.   Follow-up: 1. See Everitt Amber in 4 weeks.  Contacts: For questions or concerns you should contact:  Dr. Everitt Amber at [phone] After hours and on week-ends call [phone] and ask to speak to the physician on call for Gynecologic Oncology   Unilateral Salpingo-Oophorectomy, Care After This sheet gives you information about how to care for  yourself after your procedure. Your health care provider may also give you more specific instructions. If you have problems or questions, contact your health care provider. What can I expect after the procedure? After the procedure, it is common to have:  Abdominal pain.  Some occasional vaginal bleeding (spotting).  Tiredness. Follow these instructions at home: Incision care   Keep your incision area and your bandage (dressing) clean and dry.  Follow instructions from your health care provider about how to take care of your incision. Make sure you: ? Wash your hands with soap and water before you change your dressing. If soap and water are not available, use hand sanitizer. ? Change your dressing as told by your health care provider. ? Leave stitches (sutures), staples, skin glue, or adhesive strips in place. These skin closures may need to stay in place for 2 weeks or longer. If adhesive strip edges start to loosen and curl up, you may trim the loose edges. Do not remove adhesive strips completely unless your health care provider tells you to do that.  Check your incision area every day for signs of infection. Check for: ? Redness, swelling, or pain. ? Fluid or blood. ? Warmth. ? Pus or a bad smell. Activity  Do not drive or use heavy machinery while taking prescription pain medicine.  Do not drive for 24 hours if you received a  medicine to help you relax (sedative).  Take frequent, short walks throughout the day. Rest when you get tired. Ask your health care provider what activities are safe for you.  Avoid activities that require great effort. Also, avoid heavy lifting. Do not lift anything that is heavier than 5 lb (2.3 kg), or the limit that your health care provider tells you, until he or she says that it is safe to do so.  Do not douche, use tampons, or have sex until your health care provider approves. General instructions  To prevent or treat constipation while you are  taking prescription pain medicine, your health care provider may recommend that you: ? Drink enough fluid to keep your urine pale yellow. ? Take over-the-counter or prescription medicines. ? Eat foods that are high in fiber, such as fresh fruits and vegetables, whole grains, and beans. ? Limit foods that are high in fat and processed sugars, such as fried and sweet foods.  Take over-the-counter and prescription medicines only as told by your health care provider.  Do not take baths, swim, or use a hot tub until your health care provider approves. Ask your health care provider if you may take showers. You may only be allowed to take sponge baths.  Wear compression stockings as told by your health care provider. These stockings help to prevent blood clots and reduce swelling in your legs.  Keep all follow-up visits as told by your health care provider. This is important. Contact a health care provider if:  You have pain when you urinate.  You have pus or a bad smelling discharge coming from your vagina.  You have redness, swelling, or pain around your incision.  You have fluid or blood coming from your incision.  Your incision feels warm to the touch.  You have pus or a bad smell coming from your incision.  You have a fever.  Your incision starts to break open.  You have abdominal pain that gets worse or does not get better with medicine.  You develop a rash.  You develop nausea and vomiting.  You feel lightheaded. Get help right away if:  You develop pain in your chest or leg.  You develop shortness of breath.  You faint.  You have increased bleeding from your vagina. Summary  After the procedure, it is common to have pain, tiredness, and occasional bleeding from the vagina.  Follow instructions from your health care provider about how to take care of your incision.  Check your incision every day for signs of infection and report any symptoms to your health care  provider.  Follow instructions from your health care provider about activities and restrictions. This information is not intended to replace advice given to you by your health care provider. Make sure you discuss any questions you have with your health care provider. Document Revised: 01/27/2017 Document Reviewed: 05/26/2016 Elsevier Patient Education  [employer].   Dilation and Curettage Care After These instructions give you information about caring for yourself after your procedure. Your doctor may also give you more specific instructions. Call your doctor if you have any problems or questions after your procedure. Follow these instructions at home: Activity  Do not drive or use heavy machinery while taking prescription pain medicine.  For 24 hours after your procedure, avoid driving.  Take short walks often, followed by rest periods. Ask your doctor what activities are safe for you. After one or two days, you may be able to return to  your normal activities.  Do not lift anything that is heavier than 10 lb (4.5 kg) until your doctor approves.  For at least 2 weeks, or as long as told by your doctor: ? Do not douche. ? Do not use tampons. ? Do not have sex. General instructions   Take over-the-counter and prescription medicines only as told by your doctor. This is very important if you take blood thinning medicine.  Do not take baths, swim, or use a hot tub until your doctor approves. Take showers instead of baths.  Wear compression stockings as told by your doctor.  It is up to you to get the results of your procedure. Ask your doctor when your results will be ready.  Keep all follow-up visits as told by your doctor. This is important. Contact a doctor if:  You have very bad cramps that get worse or do not get better with medicine.  You have very bad pain in your belly (abdomen).  You cannot drink fluids without throwing up (vomiting).  You get pain in a  different part of the area between your belly and thighs (pelvis).  You have bad-smelling discharge from your vagina.  You have a rash. Get help right away if:  You are bleeding a lot from your vagina. A lot of bleeding means soaking more than one sanitary pad in an hour, for 2 hours in a row.  You have clumps of blood (blood clots) coming from your vagina.  You have a fever or chills.  Your belly feels very tender or hard.  You have chest pain.  You have trouble breathing.  You cough up blood.  You feel dizzy.  You feel light-headed.  You pass out (faint).  You have pain in your neck or shoulder area. Summary  Take short walks often, followed by rest periods. Ask your doctor what activities are safe for you. After one or two days, you may be able to return to your normal activities.  Do not lift anything that is heavier than 10 lb (4.5 kg) until your doctor approves.  Do not take baths, swim, or use a hot tub until your doctor approves. Take showers instead of baths.  Contact your doctor if you have any symptoms of infection, like bad-smelling discharge from your vagina. This information is not intended to replace advice given to you by your health care provider. Make sure you discuss any questions you have with your health care provider. Document Revised: 01/27/2017 Document Reviewed: 11/02/2015 Elsevier Patient Education  2020 [employer].

## 2020-01-14 NOTE — Anesthesia Postprocedure Evaluation (Signed)
Anesthesia Post Note  Patient: Julia Elliott  Procedure(s) Performed: XI ROBOTIC ASSISTED RIGHT SALPINGO-OOPHORECTOMY, LEFT OVARIAN CYSTECTOMY, LYSIS OF ADHESIONS (N/A ) DILATATION AND CURETTAGE /HYSTEROSCOPY (N/A )     Patient location during evaluation: PACU Anesthesia Type: General Level of consciousness: awake and alert Pain management: pain level controlled Vital Signs Assessment: post-procedure vital signs reviewed and stable Respiratory status: spontaneous breathing, nonlabored ventilation, respiratory function stable and patient connected to nasal cannula oxygen Cardiovascular status: blood pressure returned to baseline and stable Postop Assessment: no apparent nausea or vomiting Anesthetic complications: no   No complications documented.  Last Vitals:  Vitals:   01/14/20 1000 01/14/20 1015  BP: 123/75 122/75  Pulse: 76 79  Resp: 14 13  Temp:  36.4 C  SpO2: 100% 100%    Last Pain:  Vitals:   01/14/20 1015  TempSrc:   PainSc: Asleep                 Win Guajardo S

## 2020-01-15 ENCOUNTER — Telehealth: Payer: Self-pay

## 2020-01-15 ENCOUNTER — Encounter (HOSPITAL_COMMUNITY): Payer: Self-pay | Admitting: Gynecologic Oncology

## 2020-01-15 LAB — CYTOLOGY - NON PAP

## 2020-01-15 LAB — SURGICAL PATHOLOGY

## 2020-01-15 NOTE — Telephone Encounter (Signed)
LM for Julia Elliott to call back to the office to discuss how she is doing after her surgery.

## 2020-01-15 NOTE — Telephone Encounter (Signed)
Julia Elliott states that she is eating,drinking, and urinating well. Passing gas. Afebrile. Incisions D&I Pt aware of post op appointments as well as the office number [phone] and after hours number [phone] to call if she has any questions or concerns

## 2020-01-16 ENCOUNTER — Telehealth: Payer: Self-pay

## 2020-01-16 NOTE — Telephone Encounter (Signed)
TC to patient to report benign surgical pathology results.  Patient verbalized understanding and is appreciative of call.

## 2020-01-17 ENCOUNTER — Telehealth: Payer: Self-pay

## 2020-01-17 NOTE — Telephone Encounter (Signed)
Julia Elliott states that she began with vaginal bleeding yesterday.  She stated that it is bright red blood.  She is not saturating pads and used three pads in the last 24 hours. She stated that Dr. Denman George said she may have some bleeding but wasn't to be sure things were ok prior to the weekend. Pt is off the birth control pills post operatively for  blood clot prophylaxis.  The last dose of birth control was 01-13-20. Told her that this is to be expected. She could also be beginning her period.  She is to call the office if bleeding increases and she is oak through pads.

## 2020-01-20 ENCOUNTER — Telehealth: Payer: Self-pay

## 2020-01-20 ENCOUNTER — Encounter: Payer: Self-pay | Admitting: Gynecologic Oncology

## 2020-01-20 NOTE — Telephone Encounter (Signed)
Told Julia Elliott that Dr. Denman George reviewed the pictures on My Chart and said that the incisions do not look infected and healing well. Pt verbalized understanding.

## 2020-01-20 NOTE — Telephone Encounter (Signed)
Following up an after hours call from yesterday 01-19-20 regarding her incision looking infected.  She states that the LUQ incision is less pink around the edge then reported yesterday. She still can see a couple of spots under the Dermabond that looks creamy white.  The area is not more sore to the touch. No drainage.  Afebrile. Temp this am 98.3. Requested that Julia Elliott send a picture of the incision through My Chart and it can be reviewed by Dr. Denman George when she comes to the office later this am after surgery. Pt agreeable to this plan.

## 2020-01-27 ENCOUNTER — Encounter: Payer: Self-pay | Admitting: Gynecologic Oncology

## 2020-01-28 ENCOUNTER — Encounter: Payer: Self-pay | Admitting: Gynecologic Oncology

## 2020-01-28 ENCOUNTER — Inpatient Hospital Stay: Payer: 59 | Attending: Gynecologic Oncology | Admitting: Gynecologic Oncology

## 2020-01-28 ENCOUNTER — Other Ambulatory Visit: Payer: Self-pay

## 2020-01-28 VITALS — BP 117/78 | HR 101 | Temp 97.5°F | Resp 18 | Wt 112.3 lb

## 2020-01-28 DIAGNOSIS — N809 Endometriosis, unspecified: Secondary | ICD-10-CM

## 2020-01-28 DIAGNOSIS — Z90721 Acquired absence of ovaries, unilateral: Secondary | ICD-10-CM | POA: Insufficient documentation

## 2020-01-28 NOTE — Progress Notes (Signed)
Post-op Follow-up Note: Gyn-Onc  Consult was requested by Dr. Ronita Hipps for the evaluation of Julia Elliott 39 y.o. female  CC:  Chief Complaint  Patient presents with  . Endometriosis    Assessment/Plan:  [name]  is a 39 y.o.  year old s/p robotic assisted right salpingo-oophorectomy left ovarian cystectomy lysis of adhesions hysteroscopy and D&C on 01/14/2020 for an endometrioma and stage IV endometriosis.  She is doing well postop. I am recommending continued OCP use or consideration for Mirena IUD to control endometriosis. She will follow-up with Dr Ronita Hipps for ongoing management of her endometriosis and long-term gyn follow-up.   HPI: [name] is a 39 year old P0 who was seen in consultation at the request of Dr Ronita Hipps for evaluation of a right complex ovarian cyst and elevated CA 125.  The patient had recently relocated to [facility] and needed to see gyn care. She had been experiencing cyclical pelvic pain during menses for approximately 6 months and heavy menstrual cycles.  She was seen by Dr Ronita Hipps and a TVUS was performed on 10/16/19 which showed a 7.7x5.5x7.3cm right ovary with an echogenic complex cyst and hypoechoic septation. There was no internal blood flow. The appearance was most consistent with a dermoid. A 1cm endometrial polyp was also seen from anterior fundus. A simple left ovarian cyst was also seen. CA125 was drawn on 11/01/19 and this was elevated to 216. Her CEA was normal at 1.4.  She underwent a preoperative MRI of the pelvis which was suggestive of an endometrioma. In October she was married and then travelled to [place] for honeymoon.  She had been placed on continuous OCP's to control her dysmenorrhea and this was successful in controlling symptoms.   Interval Hx:  On 01/14/20 she underwent robotic assisted laparoscopic right salpingo-oophorectomy, left ovarian cystectomy, lysis of adhesions, hysteroscopy and D&C. Intraoperative  findings were significant for a stenotic cervix, stage IV endometriosis with ovaries and bilateral endometriomas (the right side was 7 cm and the left side was 3 cm) with ovaries kissing posteriorly.  Posterior cul-de-sac obliterated with the rectum adherent to the ovaries and posterior uterus.  Chocolate colored cyst fluid within the right ovary consistent with an endometrioma on frozen section.  Small subserosal uterine fibroids.  At the completion of the surgery the patient's anatomy was restored to normal. Surgery was uncomplicated.  Final pathology revealed bilateral endometriomas.  The endometrial curettings revealed scant strips of benign epithelium likely lower uterine segment or endocervical mucosa with no malignancy or polyp tissue identified.  Since surgery she has done well overall.  She has had resumption of normal menses.  She has some soreness and fatigue but no specific complaints.  Current Meds:  Outpatient Encounter Medications as of 01/28/2020  Medication Sig  . ibuprofen (ADVIL) 200 MG tablet Take 400 mg by mouth every 6 (six) hours as needed for fever, headache or mild pain.  . Multiple Vitamin (MULTIVITAMIN ADULT PO) Take 1 tablet by mouth daily.   . Norethindrone Acetate-Ethinyl Estradiol (LOESTRIN) 1.5-30 MG-MCG tablet Do not take the inactive pills at the end of the pack, Continue to new pack and take active pills continuously (Patient not taking: Reported on 01/27/2020)  . oxycodone (OXY-IR) 5 MG capsule Take 1 capsule (5 mg total) by mouth every 6 (six) hours as needed for up to 10 doses. (Patient not taking: Reported on 01/27/2020)   No facility-administered encounter medications on file as of 01/28/2020.    Allergy: No Known  Allergies  Social Hx:   Social History   Socioeconomic History  . Marital status: Married    Spouse name: Not on file  . Number of children: Not on file  . Years of education: Not on file  . Highest education level: Not on file   Occupational History  . Not on file  Tobacco Use  . Smoking status: Never Smoker  . Smokeless tobacco: Never Used  Vaping Use  . Vaping Use: Never used  Substance and Sexual Activity  . Alcohol use: Yes    Comment: once-twice a week  . Drug use: Never  . Sexual activity: Yes  Other Topics Concern  . Not on file  Social History Narrative  . Not on file   Social Determinants of Health   Financial Resource Strain:   . Difficulty of Paying Living Expenses: Not on file  Food Insecurity:   . Worried About Charity fundraiser in the Last Year: Not on file  . Ran Out of Food in the Last Year: Not on file  Transportation Needs:   . Lack of Transportation (Medical): Not on file  . Lack of Transportation (Non-Medical): Not on file  Physical Activity:   . Days of Exercise per Week: Not on file  . Minutes of Exercise per Session: Not on file  Stress:   . Feeling of Stress : Not on file  Social Connections:   . Frequency of Communication with Friends and Family: Not on file  . Frequency of Social Gatherings with Friends and Family: Not on file  . Attends Religious Services: Not on file  . Active Member of Clubs or Organizations: Not on file  . Attends Archivist Meetings: Not on file  . Marital Status: Not on file  Intimate Partner Violence:   . Fear of Current or Ex-Partner: Not on file  . Emotionally Abused: Not on file  . Physically Abused: Not on file  . Sexually Abused: Not on file    Past Surgical Hx:  Past Surgical History:  Procedure Laterality Date  . CYST EXCISION N/A    neck  . HYSTEROSCOPY WITH D & C N/A 01/14/2020   Procedure: DILATATION AND CURETTAGE /HYSTEROSCOPY;  Surgeon: Everitt Amber, MD;  Location: [facility];  Service: Gynecology;  Laterality: N/A;  . ROBOTIC ASSISTED SALPINGO OOPHERECTOMY N/A 01/14/2020   Procedure: XI ROBOTIC ASSISTED RIGHT SALPINGO-OOPHORECTOMY, LEFT OVARIAN CYSTECTOMY, LYSIS OF ADHESIONS;  Surgeon: Everitt Amber, MD;  Location: [facility];  Service: Gynecology;  Laterality: N/A;  . WISDOM TOOTH EXTRACTION      Past Medical Hx:  Past Medical History:  Diagnosis Date  . Chicken pox     Past Gynecological History:  See HPI No LMP recorded.  Family Hx:  Family History  Problem Relation Age of Onset  . Hypertension Father   . Hyperlipidemia Father     Review of Systems:  Constitutional  Feels well,   ENT Normal appearing ears and nares bilaterally Skin/Breast  No rash, sores, jaundice, itching, dryness Cardiovascular  No chest pain, shortness of breath, or edema  Pulmonary  No cough or wheeze.  Gastro Intestinal  No nausea, vomitting, or diarrhoea. No bright red blood per rectum, no abdominal pain, change in bowel movement, or constipation.  Genito Urinary  No frequency, urgency, dysuria, + dysmenorrhea Musculo Skeletal  No myalgia, arthralgia, joint swelling or pain  Neurologic  No weakness, numbness, change in gait,  Psychology  No depression, anxiety, insomnia.   Vitals:  Blood pressure 117/78, pulse (!) 101, temperature (!) 97.5 F (36.4 C), temperature source Tympanic, resp. rate 18, weight 112 lb 4.8 oz (50.9 kg), SpO2 100 %.  Physical Exam: WD in NAD Neck  Supple NROM, without any enlargements.  Lymph Node Survey No cervical supraclavicular or inguinal adenopathy Cardiovascular  Pulse normal rate, regularity and rhythm. S1 and S2 normal.  Lungs  Clear to auscultation bilateraly, without wheezes/crackles/rhonchi. Good air movement.  Skin  No rash/lesions/breakdown  Psychiatry  Alert and oriented to person, place, and time  Abdomen  Normoactive bowel sounds, abdomen soft, non-tender and thin without evidence of hernia. Incisions well healed. Back No CVA tenderness Genito Urinary  deferred Rectal  deferred Extremities  No bilateral cyanosis, clubbing or edema.   Thereasa Solo, MD  01/28/2020, 1:58 PM

## 2020-01-28 NOTE — Patient Instructions (Signed)
Dr Denman George recommends restarting your birth control pills. You can do this as soon as tonight, or choose a day easier to remember as your start date.  You can take the active pills continuously for 3 packs continuously or 4 packs continuously.  Dr. Denman George recommends taking a week of nonactive (sugar pills) pills for a week after 3 or 4 packs as this decreases the chance of having breakthrough spotting.  This would mean having 3 or 4 menstrual cycles per year.  Alternatively discussing placement of a progestin releasing IUD such as a Mirena IUD is a nice option to control symptoms of endometriosis.  Dr. Ronita Hipps could discuss placement of this with you at your next annual wellness visit.   An ultrasound that you had earlier in the year demonstrated a polyp in the endometrial cavity.  However subsequent MRI of the pelvis did not identify this within the endometrium.  The scraping from the endometrium in the operating room did not show a polyp tissue and did not show cancer or precancerous changes.  However if you develop abnormal bleeding symptoms, such as bleeding in between periods, or very heavy menstrual bleeding, please discuss the symptoms with Dr. Ronita Hipps, as these can be symptoms of an endometrial polyp which might be better treated with hysteroscopic resection which is something that Dr. Denman George does not perform.  Please contact Dr Denman George with questions about your surgery at [place].

## 2020-02-03 ENCOUNTER — Ambulatory Visit: Payer: 59 | Attending: Internal Medicine

## 2020-02-03 DIAGNOSIS — Z23 Encounter for immunization: Secondary | ICD-10-CM

## 2020-02-03 NOTE — Progress Notes (Signed)
   Covid-19 Vaccination Clinic  Name:  Julia Elliott    MRN: [id number] DOB: [birth year]  02/03/2020  Julia Elliott was observed post Covid-19 immunization for 15 minutes without incident. She was provided with Vaccine Information Sheet and instruction to access the V-Safe system.   Julia Elliott was instructed to call 911 with any severe reactions post vaccine: Marland Kitchen Difficulty breathing  . Swelling of face and throat  . A fast heartbeat  . A bad rash all over body  . Dizziness and weakness   Immunizations Administered    Name Date Dose VIS Date Route   Pfizer COVID-19 Vaccine 02/03/2020  5:37 PM 0.3 mL 12/18/2019 Intramuscular   Manufacturer: [place]   Lot: [id number]   [place]: [id number]

## 2020-02-06 ENCOUNTER — Telehealth: Payer: Self-pay

## 2020-02-06 NOTE — Telephone Encounter (Signed)
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 Fax: [phone].

## 2020-02-14 ENCOUNTER — Ambulatory Visit: Payer: 59

## 2020-03-26 ENCOUNTER — Encounter: Payer: Self-pay | Admitting: Family Medicine

## 2020-03-26 ENCOUNTER — Telehealth (INDEPENDENT_AMBULATORY_CARE_PROVIDER_SITE_OTHER): Payer: 59 | Admitting: Family Medicine

## 2020-03-26 VITALS — Temp 98.8°F | Wt 115.0 lb

## 2020-03-26 DIAGNOSIS — U071 COVID-19: Secondary | ICD-10-CM

## 2020-03-26 DIAGNOSIS — J0140 Acute pansinusitis, unspecified: Secondary | ICD-10-CM

## 2020-03-26 MED ORDER — AMOXICILLIN-POT CLAVULANATE 500-125 MG PO TABS: 1.0000 | ORAL_TABLET | Freq: Two times a day (BID) | ORAL | 0 refills | Status: AC

## 2020-03-26 NOTE — Progress Notes (Signed)
Virtual Visit via Video Note  I connected with Julia Elliott on 03/26/20 at 11:00 AM EST by a video enabled telemedicine application 2/2 [id number] pandemic and verified that I am speaking with the correct person using two identifiers.  Location patient: home Location provider:work or home office Persons participating in the virtual visit: patient, provider  I discussed the limitations of evaluation and management by telemedicine and the availability of in person appointments. The patient expressed understanding and agreed to proceed.  HPI: Pt is a 40 yo female with no sig pmh who was seen for acute concern.  Pt started feeling bad last Tuesday (1/18) and tested positive for COVID-19 on 1/20.  Pt had rhinorrhea, watery eyes, sore throat, like a "really bad head cold".  Pt notes with continued sinus pain, HA, and sinus pressure.  Pt denies ear pain/pressue, n/v.  Pt tried saline nasal spray, Advil, flonase, nyquil.  Pt unsure of where she came in contact with COVID, but notes several friends tested positive later.   Pt had at least one dose of pfizer COVID-19 vaccine.  ROS: See pertinent positives and negatives per HPI.  Past Medical History:  Diagnosis Date  . Chicken pox     Past Surgical History:  Procedure Laterality Date  . CYST EXCISION N/A    neck  . HYSTEROSCOPY WITH D & C N/A 01/14/2020   Procedure: DILATATION AND CURETTAGE /HYSTEROSCOPY;  Surgeon: Everitt Amber, MD;  Location: [facility];  Service: Gynecology;  Laterality: N/A;  . ROBOTIC ASSISTED SALPINGO OOPHERECTOMY N/A 01/14/2020   Procedure: XI ROBOTIC ASSISTED RIGHT SALPINGO-OOPHORECTOMY, LEFT OVARIAN CYSTECTOMY, LYSIS OF ADHESIONS;  Surgeon: Everitt Amber, MD;  Location: [facility];  Service: Gynecology;  Laterality: N/A;  . WISDOM TOOTH EXTRACTION      Family History  Problem Relation Age of Onset  . Hypertension Father   . Hyperlipidemia Father     Current Outpatient Medications:  .  ibuprofen (ADVIL) 200 MG tablet, Take  400 mg by mouth every 6 (six) hours as needed for fever, headache or mild pain., Disp: , Rfl:  .  Multiple Vitamin (MULTIVITAMIN ADULT PO), Take 1 tablet by mouth daily. , Disp: , Rfl:  .  Norethindrone Acetate-Ethinyl Estradiol (LOESTRIN) 1.5-30 MG-MCG tablet, Do not take the inactive pills at the end of the pack, Continue to new pack and take active pills continuously, Disp: 28 tablet, Rfl: 11  EXAM:  VITALS per patient if applicable: RR between 71-06 bpm  GENERAL: alert, oriented, appears well and in no acute distress  HEENT: atraumatic, conjunctiva clear, no obvious abnormalities on inspection of external nose and ears  NECK: normal movements of the head and neck  LUNGS: on inspection no signs of respiratory distress, breathing rate appears normal, no obvious gross SOB, gasping or wheezing  CV: no obvious cyanosis  MS: moves all visible extremities without noticeable abnormality  PSYCH/NEURO: pleasant and cooperative, no obvious depression or anxiety, speech and thought processing grossly intact  ASSESSMENT AND PLAN:  Discussed the following assessment and plan:  Acute pansinusitis, recurrence not specified  -supportive care -continue flonase and Tylenol prn - Plan: amoxicillin-clavulanate (AUGMENTIN) 500-125 MG tablet  COVID-19 virus infection -tested positive 03/19/20 -recovering -continue supportive care -given strict precautions  F/u prn   I discussed the assessment and treatment plan with the patient. The patient was provided an opportunity to ask questions and all were answered. The patient agreed with the plan and demonstrated an understanding of the instructions.   The patient was advised  to call back or seek an in-person evaluation if the symptoms worsen or if the condition fails to improve as anticipated.   Billie Ruddy, MD

## 2020-04-07 ENCOUNTER — Telehealth: Payer: Self-pay | Admitting: Family Medicine

## 2020-04-07 NOTE — Telephone Encounter (Signed)
The patient had an appointment on 03/26/2020. She requested a recovery letter to travel. If any questions, please feel free to call the patient.

## 2020-04-13 NOTE — Telephone Encounter (Signed)
Left a message for pt to call the office regarding travel letter

## 2020-05-25 ENCOUNTER — Ambulatory Visit (INDEPENDENT_AMBULATORY_CARE_PROVIDER_SITE_OTHER): Payer: 59 | Admitting: Family Medicine

## 2020-05-25 ENCOUNTER — Encounter: Payer: Self-pay | Admitting: Family Medicine

## 2020-05-25 ENCOUNTER — Other Ambulatory Visit: Payer: Self-pay

## 2020-05-25 VITALS — BP 130/90 | HR 88 | Temp 98.2°F | Wt 116.4 lb

## 2020-05-25 DIAGNOSIS — Z7184 Encounter for health counseling related to travel: Secondary | ICD-10-CM

## 2020-05-25 DIAGNOSIS — Z298 Encounter for other specified prophylactic measures: Secondary | ICD-10-CM

## 2020-05-25 DIAGNOSIS — R030 Elevated blood-pressure reading, without diagnosis of hypertension: Secondary | ICD-10-CM | POA: Diagnosis not present

## 2020-05-25 DIAGNOSIS — Z2989 Encounter for other specified prophylactic measures: Secondary | ICD-10-CM

## 2020-05-25 MED ORDER — ACETAZOLAMIDE 125 MG PO TABS: 125.0000 mg | ORAL_TABLET | Freq: Two times a day (BID) | ORAL | 0 refills | Status: DC

## 2020-05-25 NOTE — Progress Notes (Signed)
Subjective:    Patient ID: Julia Elliott, female    DOB: 07-03-1980, 40 y.o.   MRN: [id number]  No chief complaint on file.   HPI Patient was seen today for travel advice. Pt traveling to [place] for a little over a wk.  Endorses concerns about altitude sickness as going from 8,000 ft above sea level, then down, and back up to 11,000 ft.   Pt has traveled to [place] in the past, at which time she did not take altitude sickness prophylaxis and had headache and SOB.  Pt denies history of sickle cell disease, hemoglobinopathies seizure disorder, lung disease.  Past Medical History:  Diagnosis Date  . Chicken pox     No Known Allergies  ROS General: Denies fever, chills, night sweats, changes in weight, changes in appetite HEENT: Denies headaches, ear pain, changes in vision, rhinorrhea, sore throat CV: Denies CP, palpitations, SOB, orthopnea Pulm: Denies SOB, cough, wheezing GI: Denies abdominal pain, nausea, vomiting, diarrhea, constipation GU: Denies dysuria, hematuria, frequency, vaginal discharge Msk: Denies muscle cramps, joint pains Neuro: Denies weakness, numbness, tingling Skin: Denies rashes, bruising Psych: Denies depression, anxiety, hallucinations      Objective:    Blood pressure 130/90, pulse 88, temperature 98.2 F (36.8 C), temperature source Oral, weight 116 lb 6.4 oz (52.8 kg), SpO2 99 %.  Gen. Pleasant, well-nourished, in no distress, normal affect   HEENT: [place]/AT, face symmetric, conjunctiva clear, no scleral icterus, PERRLA, EOMI, nares patent without drainage Lungs: no accessory muscle use Cardiovascular: RRR, no peripheral edema Musculoskeletal: No deformities, no cyanosis or clubbing, normal tone Neuro:  A&Ox3, CN II-XII intact, normal gait Skin:  Warm, no lesions/ rash   Wt Readings from Last 3 Encounters:  05/25/20 116 lb 6.4 oz (52.8 kg)  03/26/20 115 lb (52.2 kg)  01/28/20 112 lb 4.8 oz (50.9 kg)    Lab Results  Component Value Date   WBC 5.7  01/07/2020   HGB 13.9 01/07/2020   HCT 41.7 01/07/2020   PLT 215 01/07/2020   GLUCOSE 81 12/05/2019   CHOL 197 12/05/2019   TRIG 115 12/05/2019   HDL 48 (L) 12/05/2019   LDLCALC 126 (H) 12/05/2019   ALT 9 12/05/2019   AST 11 12/05/2019   NA 137 12/05/2019   K 4.6 12/05/2019   CL 104 12/05/2019   CREATININE 0.62 12/05/2019   BUN 13 12/05/2019   CO2 25 12/05/2019   HGBA1C 5.1 12/05/2019    Assessment/Plan:  Travel advice encounter -Reviewed information for travel to [place].  Altitude sickness prophylaxis  -Discussed S/S of altitude sickness -Given patient's prior history of headache and SOB in [place] as well as rapid assent expected during upcoming trip will send in Rx for PPx -Patient advised to increase p.o. intake of water and fluids -Given handout -Given strict precautions as patient's BP typically low normal. - Plan: acetaZOLAMIDE (DIAMOX) 125 MG tablet  Elevated blood pressure reading -BP typically low normal. -Recheck improved -Given precautions and encouraged to increase p.o. hydration given upcoming trip/starting Diamox for altitude sickness prophylaxis.  F/u prn  Grier Mitts, MD

## 2020-05-25 NOTE — Patient Instructions (Signed)
Altitude Sickness Altitude sickness occurs when a person goes to a high altitude without first letting the body adjust (acclimate) to the higher altitude.Depending on the severity, altitude sickness can be a medical emergency. It can develop into a life-threatening condition. What are the causes? This condition is caused by rapidly going to an altitude of at least 8,200 ft (2,460 m) above sea level. At this altitude, the air pressure and oxygen levels are lower. What increases the risk? This condition can happen to anyone, regardless of physical condition. However, you are more likely to develop the condition if:  You go to a high altitude quickly and are physically activeat that altitude. This may include exercising or moving a lot.  You have a chronic breathing (respiratory) condition. What are the signs or symptoms? Symptoms of this condition usually develop within 72 hours of arriving at the high altitude. Symptoms of this condition include:  A severe headache.  Nausea and vomiting.  Shortness of breath.  Dizziness.  Confusion.  Uncoordinated movements.  Fatigue.  Trouble sleeping.  Weakness.  Hallucinations. How is this diagnosed? This condition may be diagnosed based on a physical exam and your medical history. Sometimes a chest X-ray is taken. How is this treated? Treatment for this condition depends on the severity of the condition.  In mild cases, treatment may not be needed as symptoms will gradually go away on their own in 3-5 days.  In more serious cases, you may need treatment. To treat the condition: ? You will be moved to an altitude of 1,800 ft (540 m) above sea level or lower as quickly and safely as possible. ? You may also be given oxygen and medicines to help with breathing.  If your condition is severe, you may need to stay in the hospital. Follow these instructions at home:  If you must exercise, do only light exercise for the first 24-36 hours  after treatment.  Drink enough fluids to keep your urine pale yellow.  Eat small, light meals.  Avoid: ? Taking calming medicines (sedatives). ? Alcohol.  Do not use any products that contain nicotine or tobacco, such as cigarettes and e-cigarettes. If you need help quitting, ask your health care provider.  Stay at a low altitude until your symptoms improve.  Have someone stay with you until you feel stable.  Keep all follow-up visits as told by your health care provider. This is important.   How is this prevented?  Go to higher altitudes slowly, giving your body time to acclimate.  Go to higher altitudes during the daytime and return to lower altitudes at night.  Give your body a few days to adjust to a change in altitude before starting physical activities that require great effort.  Ask your health care provider about medicines you can take to prevent altitude sickness. Get help right away if:  You have: ? Chest pain or tightness. ? A fast heartbeat. ? A severe headache. ? A severe cough. ? Difficulty walking. ? Difficulty concentrating. ? Severe shortness of breath at rest or with exertion.  You feel confused. Summary  Altitude sickness occurs when a person goes to a high altitude without first letting the body adjust to the higher altitude.  Symptoms include headache, nausea, vomiting, confusion, and shortness of breath.  Depending on the severity, altitude sickness can be a medical emergency. It can develop into a life-threatening condition.  Get medical help right away if you have chest pain, a fast heartbeat, trouble breathing, or  you feel confused. This information is not intended to replace advice given to you by your health care provider. Make sure you discuss any questions you have with your health care provider. Document Revised: 06/08/2017 Document Reviewed: 06/08/2017 Elsevier Patient Education  [place].

## 2020-10-19 ENCOUNTER — Ambulatory Visit (INDEPENDENT_AMBULATORY_CARE_PROVIDER_SITE_OTHER): Payer: 59 | Admitting: Family Medicine

## 2020-10-19 ENCOUNTER — Other Ambulatory Visit: Payer: Self-pay

## 2020-10-19 ENCOUNTER — Encounter: Payer: Self-pay | Admitting: Family Medicine

## 2020-10-19 VITALS — BP 120/98 | HR 91 | Temp 99.0°F | Ht 63.0 in | Wt 121.8 lb

## 2020-10-19 DIAGNOSIS — E7841 Elevated Lipoprotein(a): Secondary | ICD-10-CM

## 2020-10-19 DIAGNOSIS — Z131 Encounter for screening for diabetes mellitus: Secondary | ICD-10-CM

## 2020-10-19 DIAGNOSIS — Z0000 Encounter for general adult medical examination without abnormal findings: Secondary | ICD-10-CM | POA: Diagnosis not present

## 2020-10-19 DIAGNOSIS — R030 Elevated blood-pressure reading, without diagnosis of hypertension: Secondary | ICD-10-CM

## 2020-10-19 DIAGNOSIS — M79671 Pain in right foot: Secondary | ICD-10-CM | POA: Diagnosis not present

## 2020-10-19 LAB — CBC WITH DIFFERENTIAL/PLATELET
Basophils Absolute: 0 10*3/uL (ref 0.0–0.1)
Basophils Relative: 0.8 % (ref 0.0–3.0)
Eosinophils Absolute: 0 10*3/uL (ref 0.0–0.7)
Eosinophils Relative: 1 % (ref 0.0–5.0)
HCT: 41.5 % (ref 36.0–46.0)
Hemoglobin: 14.1 g/dL (ref 12.0–15.0)
Lymphocytes Relative: 36.6 % (ref 12.0–46.0)
Lymphs Abs: 1.7 10*3/uL (ref 0.7–4.0)
MCHC: 33.9 g/dL (ref 30.0–36.0)
MCV: 93.8 fl (ref 78.0–100.0)
Monocytes Absolute: 0.1 10*3/uL (ref 0.1–1.0)
Monocytes Relative: 3.1 % (ref 3.0–12.0)
Neutro Abs: 2.7 10*3/uL (ref 1.4–7.7)
Neutrophils Relative %: 58.5 % (ref 43.0–77.0)
Platelets: 173 10*3/uL (ref 150.0–400.0)
RBC: 4.43 Mil/uL (ref 3.87–5.11)
RDW: 13.3 % (ref 11.5–15.5)
WBC: 4.6 10*3/uL (ref 4.0–10.5)

## 2020-10-19 LAB — LIPID PANEL
Cholesterol: 203 mg/dL — ABNORMAL HIGH (ref 0–200)
HDL: 38.8 mg/dL — ABNORMAL LOW (ref >39.00)
LDL Cholesterol: 134 mg/dL — ABNORMAL HIGH (ref 0–99)
NonHDL: 164.31
Total CHOL/HDL Ratio: 5
Triglycerides: 151 mg/dL — ABNORMAL HIGH (ref 0.0–149.0)
VLDL: 30.2 mg/dL (ref 0.0–40.0)

## 2020-10-19 LAB — COMPREHENSIVE METABOLIC PANEL
ALT: 11 U/L (ref 0–35)
AST: 14 U/L (ref 0–37)
Albumin: 4.3 g/dL (ref 3.5–5.2)
Alkaline Phosphatase: 30 U/L — ABNORMAL LOW (ref 39–117)
BUN: 8 mg/dL (ref 6–23)
CO2: 22 mEq/L (ref 19–32)
Calcium: 9 mg/dL (ref 8.4–10.5)
Chloride: 101 mEq/L (ref 96–112)
Creatinine, Ser: 0.64 mg/dL (ref 0.40–1.20)
GFR: 112.35 mL/min (ref >60.00)
Glucose, Bld: 79 mg/dL (ref 70–99)
Potassium: 4 mEq/L (ref 3.5–5.1)
Sodium: 133 mEq/L — ABNORMAL LOW (ref 135–145)
Total Bilirubin: 0.5 mg/dL (ref 0.2–1.2)
Total Protein: 7.2 g/dL (ref 6.0–8.3)

## 2020-10-19 LAB — HEMOGLOBIN A1C: Hgb A1c MFr Bld: 5.2 % (ref 4.6–6.5)

## 2020-10-19 LAB — T4, FREE: Free T4: 0.9 ng/dL (ref 0.60–1.60)

## 2020-10-19 LAB — TSH: TSH: 0.69 u[IU]/mL (ref 0.35–5.50)

## 2020-10-19 NOTE — Progress Notes (Signed)
Subjective:     Julia Elliott is a 40 y.o. female and is here for a comprehensive physical exam. The patient reports  R heel pain .  Intermittent, kind of like a soreness.  Pt notes recent increased walking and biking while in [place], [place].  Pt also inquires about discoloration of skin on chest.  Area is non pruritic.  Wearing sunscreen on face.  Notes difficulty at times eating healthy foods.  Concerned about cholesterol as elevated in the past.  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    Social History   Socioeconomic History   Marital status: Married    Spouse name: Not on file   Number of children: Not on file   Years of education: Not on file   Highest education level: Not on file  Occupational History   Not on file  Tobacco Use   Smoking status: Never   Smokeless tobacco: Never  Vaping Use   Vaping Use: Never used  Substance and Sexual Activity   Alcohol use: Yes    Comment: once-twice a week   Drug use: Never   Sexual activity: Yes  Other Topics Concern   Not on file  Social History Narrative   Not on file   Social Determinants of Health   Financial Resource Strain: Not on file  Food Insecurity: Not on file  Transportation Needs: Not on file  Physical Activity: Not on file  Stress: Not on file  Social Connections: Not on file  Intimate Partner Violence: Not on file   Health Maintenance  Topic Date Due   HIV Screening  Never done   Hepatitis C Screening  Never done   INFLUENZA VACCINE  09/28/2020   TETANUS/TDAP  12/04/2020 (Originally 04/28/2019)   PAP SMEAR-Modifier  09/27/2022   COVID-19 Vaccine  Completed   Pneumococcal Vaccine 39-49 Years old  Aged Out   HPV VACCINES  Aged Out    The following portions of the patient's history were reviewed and updated as appropriate: allergies, current medications, past family history, past medical history, past social history, past surgical history, and problem  list.  Review of Systems Pertinent items noted in HPI and remainder of comprehensive ROS otherwise negative.   Objective:    BP (!) 120/98 (BP Location: Left Arm, Patient Position: Sitting, Cuff Size: Normal)   Pulse 91   Temp 99 F (37.2 C) (Oral)   Ht '5\' 3"'$  (1.6 m)   Wt 121 lb 12.8 oz (55.2 kg)   SpO2 99%   BMI 21.58 kg/m  General appearance: alert, cooperative, and no distress Head: Normocephalic, without obvious abnormality, atraumatic Eyes: conjunctivae/corneas clear. PERRL, EOM's intact. Fundi benign. Ears: normal TM's and external ear canals both ears Nose: Nares normal. Septum midline. Mucosa normal. No drainage or sinus tenderness. Throat: lips, mucosa, and tongue normal; teeth and gums normal Neck: no adenopathy, no carotid bruit, no JVD, supple, symmetrical, trachea midline, and thyroid not enlarged, symmetric, no tenderness/mass/nodules Lungs: clear to auscultation bilaterally Heart: regular rate and rhythm, S1, S2 normal, no murmur, click, rub or gallop Abdomen: soft, non-tender; bowel sounds normal; no masses,  no organomegaly Extremities: extremities normal, atraumatic, no cyanosis or edema Pulses: 2+ and symmetric Skin: Skin color, texture, turgor normal. A hypopigmented flat area of skin 3 mm on R medial upper chest surrounded by mildy flushed skin.  No rashes or lesions.    Lymph nodes: Cervical, supraclavicular, and axillary nodes normal.  Neurologic: Alert and oriented X 3, normal strength and tone. Normal symmetric reflexes. Normal coordination and gait    Assessment:    Healthy female exam.      Plan:    Anticipatory guidance given including wearing seatbelts, smoke detectors in the home, increasing physical activity, increasing p.o. intake of water and vegetables. -PHQ 9 and GAD 7 score 0 -labs -followed by OB/Gyn, s/p L salpingo-oophorectomy with lysis of adhesions 2/2 ovarian cyst. Given handout -Next CPE in 1 year See After Visit Summary for  Counseling Recommendations   Elevated lipoprotein(a)  -Lifestyle modifications - Plan: Lipid panel, CMP  Screening for diabetes mellitus  - Plan: Hemoglobin A1c  Pain of right heel -Discussed possible causes including bone spur, plantar fasciitis, heel pad atrophy -Discussed the importance of wearing proper fitting shoes -Discussed changing shoes more frequently given increased use.  Consider OTC insoles -Discussed stretching and NSAIDs as needed.  Given handout -Continue to monitor  Elevated blood pressure reading without diagnosis of hypertension -120/98 -Recheck -Lifestyle modifications -Follow-up in 3 months if for continued elevation consider starting medication  Grier Mitts, MD

## 2021-03-16 IMAGING — MR MR PELVIS WO/W CM
5 of 11 series · 16 of 40 positions shown · IV contrast (Yes GAD)
Comparison: None.
COMPARISON: None.

Addendum:
CLINICAL DATA: Elevated CA 125, suspicion for ovarian neoplasm in
the setting of large tubo-ovarian mass on ultrasound

EXAM:
MRI PELVIS WITHOUT AND WITH CONTRAST
TECHNIQUE: Multiplanar multisequence MR imaging of the pelvis was performed
both before and after administration of intravenous contrast.
CONTRAST:  5mL GADAVIST GADOBUTROL 1 MMOL/ML IV SOLN

[Series 4: T2 fat-sat · axial · 5.0mm · 0.51mm/px · z∈[-34,+188]mm · 3 of 38 slices shown]
[im 1/38]
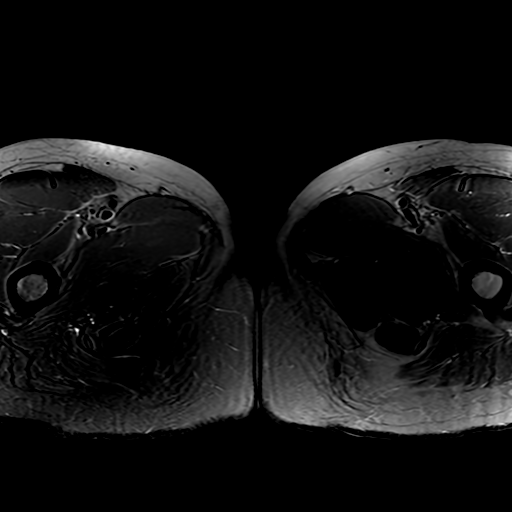
[im 19/38]
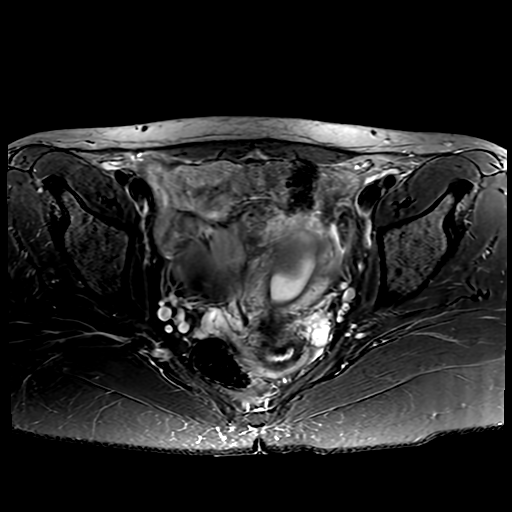
[im 38/38]
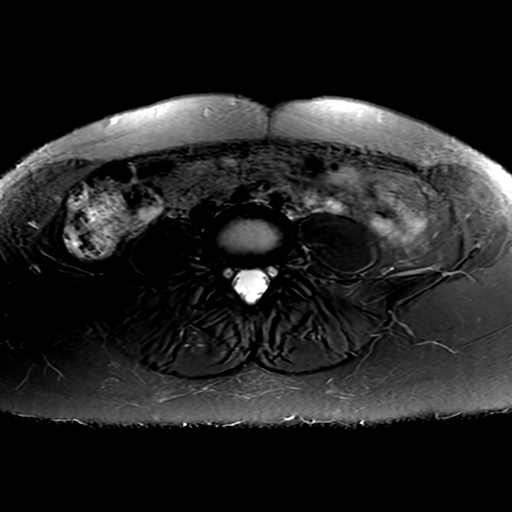

[Series 7: T1 fat-sat · axial · non-contrast · 5.0mm · 0.51mm/px · z∈[-34,+188]mm · 3 of 38 slices shown]
[im 1/38]
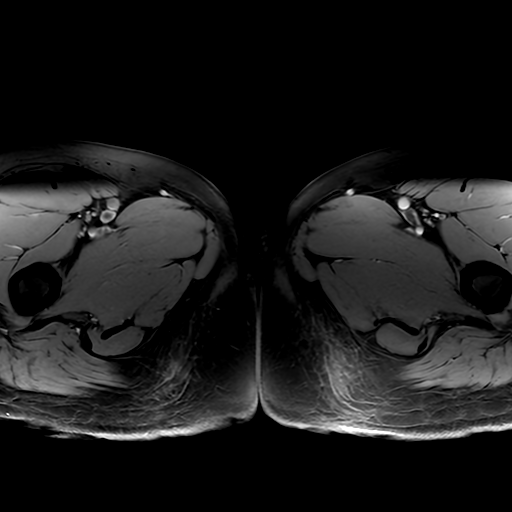
[im 19/38]
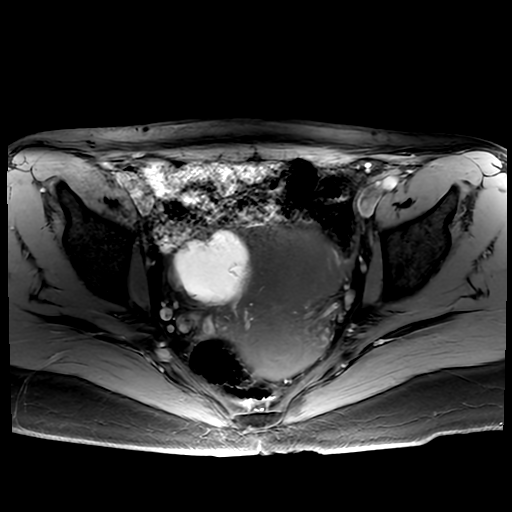
[im 38/38]
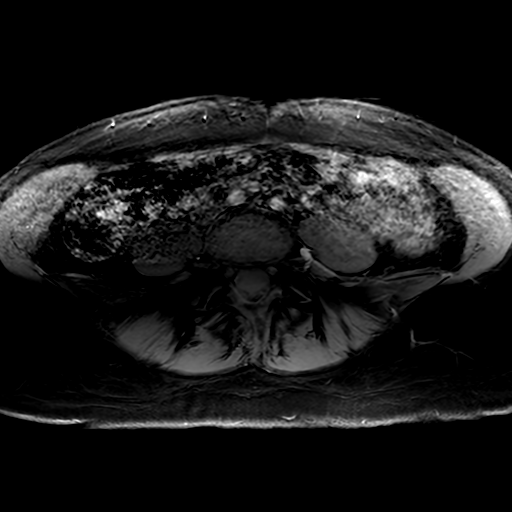

[Series 8: T1 fat-sat post-contrast · axial · 5.0mm · 0.51mm/px · z∈[-34,+188]mm · 3 of 38 slices shown (1 of 2)]
[im 1/38]
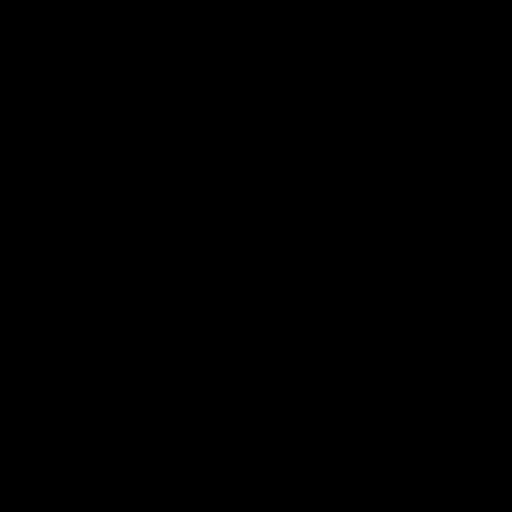
[im 19/38]
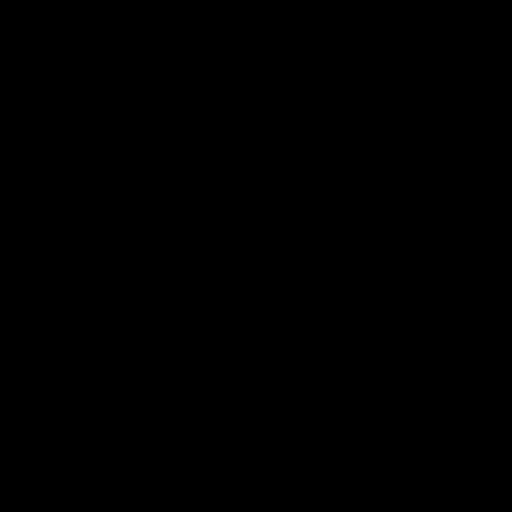
[im 38/38]
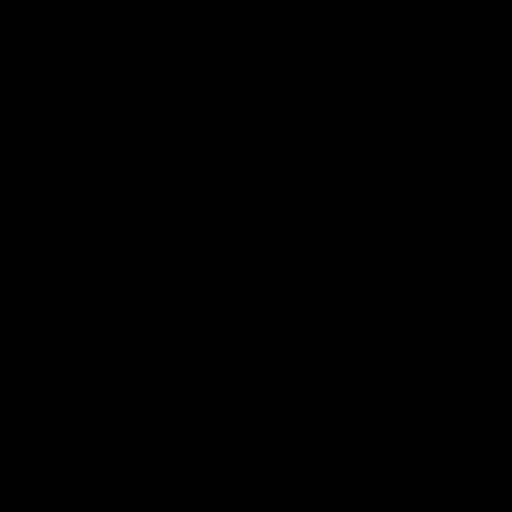

[Series 9: T1 fat-sat post-contrast · coronal · 5.0mm · 0.59mm/px · 3 of 36 slices shown (2 of 2)]
[im 1/36]
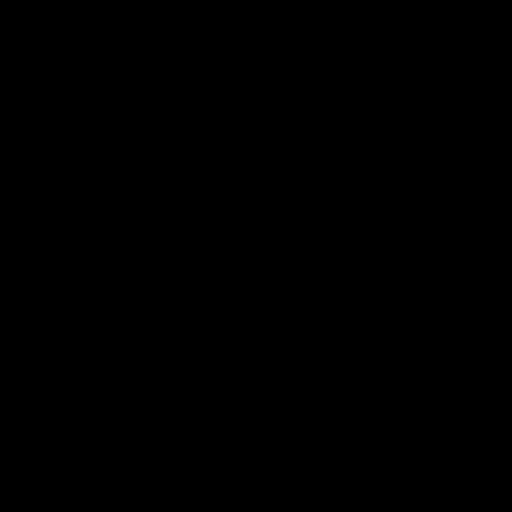
[im 18/36]
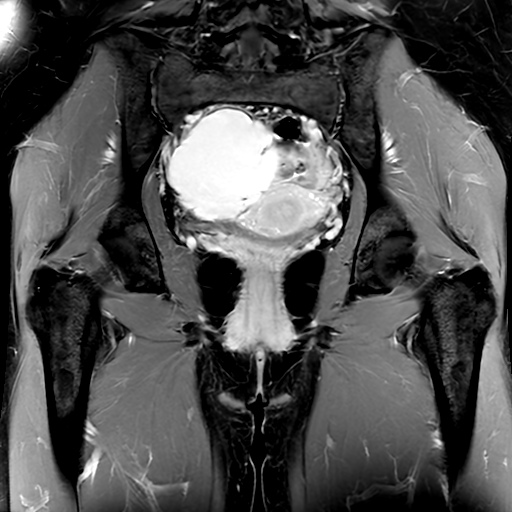
[im 36/36]
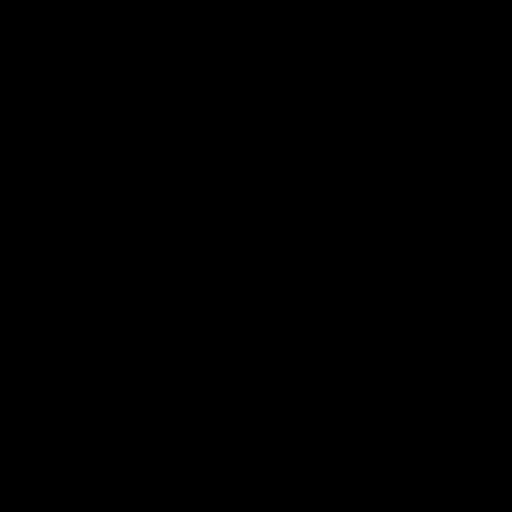

[Series 601: T1 dynamic · axial · 5.0mm · 0.82mm/px · z∈[-22,+76]mm · 4 of 80 slices shown]
[im 1/80]
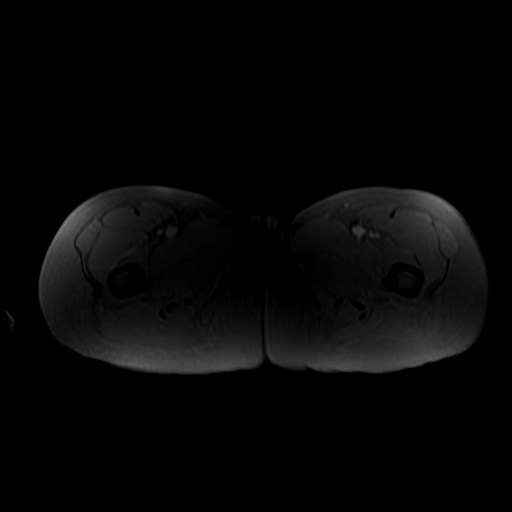
[im 14/80]
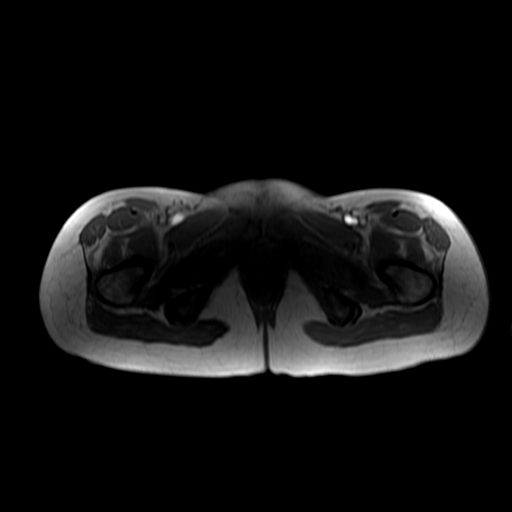
[im 27/80]
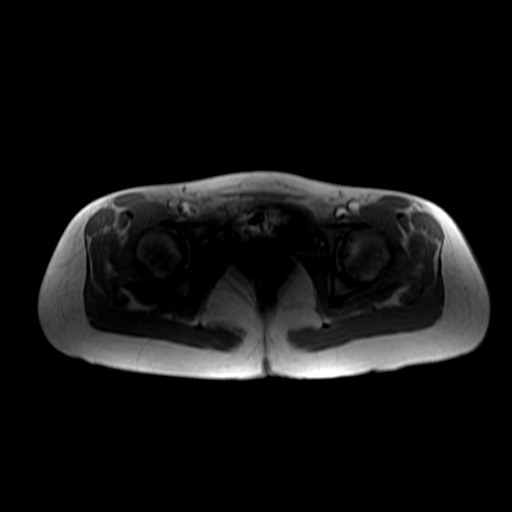
[im 40/80]
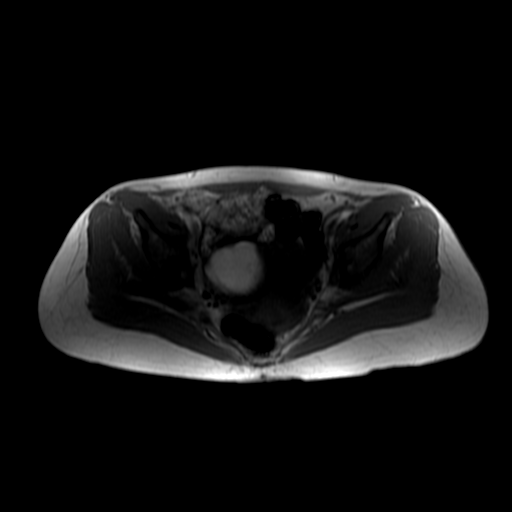

[16 of 40 positions shown; findings below may reference images not displayed]

FINDINGS: Urinary Tract: Urinary bladder is collapsed. No distal ureteral
dilation.

Bowel: No acute bowel process to the extent evaluated, only limited
assessment of bowel loops on today's study.

Vascular/Lymphatic: Vascular structures in the pelvis are patent. No
pelvic sidewall lymphadenopathy.

Reproductive: Large RIGHT adnexal mass measuring 6.4 x 5.9 x 6.8 cm
showing "T2 shading" and marked T1 hyperintensity in and out of
phase images without signs of signal loss.

In the LEFT adnexa there is a similar area of signal change with
cystic features measuring 2.1 x 1.9 cm with smaller areas of T1
hyperintensity and T2 shading, at least 3 additional foci in the
LEFT adnexa likely intimately associated with the LEFT ovary.

The RIGHT ovary is likely splayed atop the RIGHT adnexal process,
perhaps this process is centered more in the tube or broad ligament
though intra-ovarian involvement is difficult to exclude on the
current exam.

Normal endometrial thickness for age.

Junctional zone upper limits of normal along the posterior margin
approximately 11 mm greatest thickness.

Postcontrast images were performed utilizing 2D gradient technique.
Subtraction images would not be valid for this dataset. Subtraction
imaging given marked intrinsic T1 hyperintensity would be needed to
assess for subtle areas of nodular mural enhancement.

Other:  No ascites.

Musculoskeletal: No focal, suspicious bone abnormality or acute bone
process.
IMPRESSION: 1. Bilateral adnexal lesions largest on the RIGHT compatible with
endometriomas. No comparison is available to determine whether size
has increased. Subtraction imaging not performed on current exam and
would be necessary to assess for any subtle areas of nodular mural
enhancement. For this reason would suggest recalling the patient for
dynamic postcontrast 3D gradient echo imaging both pre and
postcontrast with subtraction data set. Large size is noted though
there is no comparison to track for change in size. Preserved
shading and generally low T2 signal in the dominant lesion is
reassuring.
2. Borderline thickening of the junctional zone may be indicative of
adenomyosis or small leiomyomata.

ADDENDUM:
Subtraction data set is made available for review with no signs of
nodularity on subtraction that would support the diagnosis of
malignant transformation. No priors are available for review to
determine whether this has enlarged considerably. As such, there are
no current overtly suspicious features to suggest malignant
transformation on the current imaging study. Given the subtraction
images have been generated the patient the not return for additional
imaging for this examination. Otherwise findings are as outlined in
the initial report.

*** End of Addendum ***
FINDINGS: Urinary Tract: Urinary bladder is collapsed. No distal ureteral
dilation.

Bowel: No acute bowel process to the extent evaluated, only limited
assessment of bowel loops on today's study.

Vascular/Lymphatic: Vascular structures in the pelvis are patent. No
pelvic sidewall lymphadenopathy.

Reproductive: Large RIGHT adnexal mass measuring 6.4 x 5.9 x 6.8 cm
showing "T2 shading" and marked T1 hyperintensity in and out of
phase images without signs of signal loss.

In the LEFT adnexa there is a similar area of signal change with
cystic features measuring 2.1 x 1.9 cm with smaller areas of T1
hyperintensity and T2 shading, at least 3 additional foci in the
LEFT adnexa likely intimately associated with the LEFT ovary.

The RIGHT ovary is likely splayed atop the RIGHT adnexal process,
perhaps this process is centered more in the tube or broad ligament
though intra-ovarian involvement is difficult to exclude on the
current exam.

Normal endometrial thickness for age.

Junctional zone upper limits of normal along the posterior margin
approximately 11 mm greatest thickness.

Postcontrast images were performed utilizing 2D gradient technique.
Subtraction images would not be valid for this dataset. Subtraction
imaging given marked intrinsic T1 hyperintensity would be needed to
assess for subtle areas of nodular mural enhancement.

Other:  No ascites.

Musculoskeletal: No focal, suspicious bone abnormality or acute bone
process.
IMPRESSION: 1. Bilateral adnexal lesions largest on the RIGHT compatible with
endometriomas. No comparison is available to determine whether size
has increased. Subtraction imaging not performed on current exam and
would be necessary to assess for any subtle areas of nodular mural
enhancement. For this reason would suggest recalling the patient for
dynamic postcontrast 3D gradient echo imaging both pre and
postcontrast with subtraction data set. Large size is noted though
there is no comparison to track for change in size. Preserved
shading and generally low T2 signal in the dominant lesion is
reassuring.
2. Borderline thickening of the junctional zone may be indicative of
adenomyosis or small leiomyomata.

## 2021-04-26 DIAGNOSIS — J019 Acute sinusitis, unspecified: Secondary | ICD-10-CM | POA: Diagnosis not present

## 2021-05-06 ENCOUNTER — Encounter: Payer: Self-pay | Admitting: Family Medicine

## 2021-05-06 ENCOUNTER — Ambulatory Visit (INDEPENDENT_AMBULATORY_CARE_PROVIDER_SITE_OTHER): Payer: BC Managed Care – PPO | Admitting: Family Medicine

## 2021-05-06 VITALS — BP 133/90 | HR 104 | Temp 99.3°F | Wt 119.6 lb

## 2021-05-06 DIAGNOSIS — J0140 Acute pansinusitis, unspecified: Secondary | ICD-10-CM

## 2021-05-06 MED ORDER — DOXYCYCLINE HYCLATE 100 MG PO TABS: 100.0000 mg | ORAL_TABLET | Freq: Two times a day (BID) | ORAL | 0 refills | Status: AC

## 2021-05-06 NOTE — Progress Notes (Signed)
Subjective:  ? ? Patient ID: Julia Elliott, female    DOB: March 17, 1980, 41 y.o.   MRN: [id number] ? ?Chief Complaint  ?Patient presents with  ? Sinus Problem  ?  Headache since about 2 weeks, got amoxi for 7 days 2/27-3/5. Started feelin bad aagain on Monday. Mucinex, flonase and advill for pain  ? Dental Pain  ?  Started yesterday   ? ? ?HPI ?Patient was seen today for acute concern.  Patient endorses sinus headache/pressure and throat irritation.  Initial symptoms started 2 weeks ago, seen virtually, treated with amoxicillin for sinusitis.  Symptoms improved after completing medications and then returned a few days later.  Started having dental pain yesterday.  Patient endorses low-grade fever.  Patient endorses travel out of state and sick contact with nephew during start of original symptoms. ? ?Past Medical History:  ?Diagnosis Date  ? Chicken pox   ? ? ?No Known Allergies ? ?ROS ?General: Denies fever, chills, night sweats, changes in weight, changes in appetite +subjective fever ?HEENT: Denies headaches, ear pain, changes in vision, rhinorrhea, sore throat + sinus pressure/pain, dental pain, throat irritation ?CV: Denies CP, palpitations, SOB, orthopnea ?Pulm: Denies SOB, cough, wheezing ?GI: Denies abdominal pain, nausea, vomiting, diarrhea, constipation ?GU: Denies dysuria, hematuria, frequency, vaginal discharge ?Msk: Denies muscle cramps, joint pains ?Neuro: Denies weakness, numbness, tingling ?Skin: Denies rashes, bruising ?Psych: Denies depression, anxiety, hallucinations ? ?   ?Objective:  ?  ?Blood pressure 133/90, pulse (!) 104, temperature 99.3 ?F (37.4 ?C), temperature source Oral, weight 119 lb 9.6 oz (54.3 kg), SpO2 98 %. ? ?Gen. Pleasant, well-nourished, in no distress, normal affect   ?HEENT: [place]/AT, face symmetric, conjunctiva clear, no scleral icterus, PERRLA, EOMI, nares patent without drainage, no TTP of sinuses.  Pharynx without erythema or exudate.  TMs full b/l. ?Lungs: no accessory  muscle use, CTAB, no wheezes or rales ?Cardiovascular: RRR, no m/r/g, no peripheral edema ?Musculoskeletal: No deformities, no cyanosis or clubbing, normal tone ?Neuro:  A&Ox3, CN II-XII intact, normal gait ?Skin:  Warm, no lesions/ rash ? ? ?Wt Readings from Last 3 Encounters:  ?05/06/21 119 lb 9.6 oz (54.3 kg)  ?10/19/20 121 lb 12.8 oz (55.2 kg)  ?05/25/20 116 lb 6.4 oz (52.8 kg)  ? ? ?Lab Results  ?Component Value Date  ? WBC 4.6 10/19/2020  ? HGB 14.1 10/19/2020  ? HCT 41.5 10/19/2020  ? PLT 173.0 10/19/2020  ? GLUCOSE 79 10/19/2020  ? CHOL 203 (H) 10/19/2020  ? TRIG 151.0 (H) 10/19/2020  ? HDL 38.80 (L) 10/19/2020  ? LDLCALC 134 (H) 10/19/2020  ? ALT 11 10/19/2020  ? AST 14 10/19/2020  ? NA 133 (L) 10/19/2020  ? K 4.0 10/19/2020  ? CL 101 10/19/2020  ? CREATININE 0.64 10/19/2020  ? BUN 8 10/19/2020  ? CO2 22 10/19/2020  ? TSH 0.69 10/19/2020  ? HGBA1C 5.2 10/19/2020  ? ? ?Assessment/Plan: ? ?Acute pansinusitis, recurrence not specified  ?-failed treatment with Amoxicillin ?-start doxy.  Limit sun exposure ?-supportive care with OTC antihistamines, Flonase, or saline nasal rinse, Tylenol as needed ?- Plan: doxycycline (VIBRA-TABS) 100 MG tablet ? ?F/u prn for continued or worsening symptoms. ? ?Grier Mitts, MD ?

## 2021-05-20 ENCOUNTER — Ambulatory Visit (INDEPENDENT_AMBULATORY_CARE_PROVIDER_SITE_OTHER): Payer: BC Managed Care – PPO | Admitting: Family Medicine

## 2021-05-20 ENCOUNTER — Encounter: Payer: Self-pay | Admitting: Family Medicine

## 2021-05-20 VITALS — BP 121/86 | HR 104 | Temp 99.0°F | Wt 119.0 lb

## 2021-05-20 DIAGNOSIS — J029 Acute pharyngitis, unspecified: Secondary | ICD-10-CM | POA: Diagnosis not present

## 2021-05-20 DIAGNOSIS — J019 Acute sinusitis, unspecified: Secondary | ICD-10-CM | POA: Diagnosis not present

## 2021-05-20 LAB — POCT RAPID STREP A (OFFICE): Rapid Strep A Screen: NEGATIVE

## 2021-05-20 MED ORDER — LEVOFLOXACIN 500 MG PO TABS: 500.0000 mg | ORAL_TABLET | Freq: Every day | ORAL | 0 refills | Status: AC

## 2021-05-20 NOTE — Patient Instructions (Signed)
Strep test was negative. ? ?A prescription for Levaquin was sent to your pharmacy.  You are to take 1 pill daily x10 days.  While taking the medication monitor for diarrhea as this antibiotic can occasionally cause diarrhea due to decreasing your good gut flora.  Consider taking a probiotic or eating yogurt while taking this medication. ? ?For continued symptoms we will place a referral to the ear nose and throat specialist. ?

## 2021-05-20 NOTE — Progress Notes (Signed)
Subjective:  ? ? Patient ID: Julia Elliott, female    DOB: October 07, 1980, 41 y.o.   MRN: [id number] ? ?Chief Complaint  ?Patient presents with  ? Follow-up  ?  Sinus, headache, teeth pain -took antibiotic. Felt good after antibiotics, but started feeling bad again. Sinus pressure in face under left eye. Coughing up greenish- yellow mucus. Has taken mucinex, flonase  since the antibiotics.   ? ? ?HPI ?Patient was seen today for ongoing symptoms.  Patient endorses continued sinus pain/pressure, headache, dental pain, low-grade fever, sore/scratchy throat.  Initially seen virtually at the end of February started on amoxicillin x 7 days.  Symptoms improved then returned.  Patient started on doxycycline by this provider 05/06/2021.   ? ?Pt has a 2 wk trip to [place] coming up. ? ?Past Medical History:  ?Diagnosis Date  ? Chicken pox   ? ? ?No Known Allergies ? ?ROS ?General: Denies fever, chills, night sweats, changes in weight, changes in appetite ?HEENT: Denies ear pain, changes in vision, rhinorrhea, sore throat  +sinus pain/pressure, dental pain, HA, sore throat ?CV: Denies CP, palpitations, SOB, orthopnea ?Pulm: Denies SOB, cough, wheezing ?GI: Denies abdominal pain, nausea, vomiting, diarrhea, constipation ?GU: Denies dysuria, hematuria, frequency, vaginal discharge ?Msk: Denies muscle cramps, joint pains ?Neuro: Denies weakness, numbness, tingling ?Skin: Denies rashes, bruising ?Psych: Denies depression, anxiety, hallucinations ? ?   ?Objective:  ?  ?Blood pressure 121/86, pulse (!) 104, temperature 99 ?F (37.2 ?C), temperature source Oral, weight 119 lb (54 kg), SpO2 99 %. ? ?Gen. Pleasant, well-nourished, in no distress, normal affect   ?HEENT: [place]/AT, face symmetric, conjunctiva clear, no scleral icterus, PERRLA, EOMI.  Nares patent,  L more narrow than R with drainage,  erythema and edema, pharynx with moderate erythema, no exudate. TMs full b/l. ?Lungs: no accessory muscle use, CTAB, no wheezes or  rales ?Cardiovascular: RRR, no m/r/g, no peripheral edema. ?Musculoskeletal: No deformities, no cyanosis or clubbing, normal tone ?Neuro:  A&Ox3, CN II-XII intact, normal gait ?Skin:  Warm, no lesions/ rash ? ? ?Wt Readings from Last 3 Encounters:  ?05/06/21 119 lb 9.6 oz (54.3 kg)  ?10/19/20 121 lb 12.8 oz (55.2 kg)  ?05/25/20 116 lb 6.4 oz (52.8 kg)  ? ? ?Lab Results  ?Component Value Date  ? WBC 4.6 10/19/2020  ? HGB 14.1 10/19/2020  ? HCT 41.5 10/19/2020  ? PLT 173.0 10/19/2020  ? GLUCOSE 79 10/19/2020  ? CHOL 203 (H) 10/19/2020  ? TRIG 151.0 (H) 10/19/2020  ? HDL 38.80 (L) 10/19/2020  ? LDLCALC 134 (H) 10/19/2020  ? ALT 11 10/19/2020  ? AST 14 10/19/2020  ? NA 133 (L) 10/19/2020  ? K 4.0 10/19/2020  ? CL 101 10/19/2020  ? CREATININE 0.64 10/19/2020  ? BUN 8 10/19/2020  ? CO2 22 10/19/2020  ? TSH 0.69 10/19/2020  ? HGBA1C 5.2 10/19/2020  ? ? ?Assessment/Plan: ? ?Subacute rhinosinusitis  ?-failed abx courses or amoxicillin and doxycycline. ?-will start levofloxacin.  Disscused r/b/a.  Pt to notify clinic if develops diarrhea. ?-continue OTC nasal saline rinse and flonase ?-for continued symptoms discussed obtaining CT sinuses, cx for fungal infection, and referral to ENT. ?- Plan: levofloxacin (LEVAQUIN) 500 MG tablet ? ?Sore throat  ?-rapid strep testing negative ?- Plan: POC Rapid Strep A ? ?F/u prn ? ?Grier Mitts, MD ?

## 2021-08-24 DIAGNOSIS — Z01419 Encounter for gynecological examination (general) (routine) without abnormal findings: Secondary | ICD-10-CM | POA: Diagnosis not present

## 2021-08-24 DIAGNOSIS — Z6821 Body mass index (BMI) 21.0-21.9, adult: Secondary | ICD-10-CM | POA: Diagnosis not present

## 2021-09-24 DIAGNOSIS — R1084 Generalized abdominal pain: Secondary | ICD-10-CM | POA: Diagnosis not present

## 2021-10-06 ENCOUNTER — Ambulatory Visit (INDEPENDENT_AMBULATORY_CARE_PROVIDER_SITE_OTHER): Payer: BC Managed Care – PPO | Admitting: Family Medicine

## 2021-10-06 ENCOUNTER — Encounter: Payer: Self-pay | Admitting: Family Medicine

## 2021-10-06 VITALS — BP 124/82 | HR 98 | Temp 99.4°F | Wt 124.6 lb

## 2021-10-06 DIAGNOSIS — K5909 Other constipation: Secondary | ICD-10-CM

## 2021-10-06 MED ORDER — SENNOSIDES-DOCUSATE SODIUM 8.6-50 MG PO TABS: 1.0000 | ORAL_TABLET | Freq: Every day | ORAL | 0 refills | Status: DC

## 2021-10-06 NOTE — Progress Notes (Signed)
Subjective:    Patient ID: Julia Elliott, female    DOB: 07-Jan-1981, 41 y.o.   MRN: [id number]  Chief Complaint  Patient presents with   GI Problem    Constipated, can go daily, but will have to sit about an hour and then it is not normal. Has been back in states for 3 weeks.  Tenderness at top of abdomen, constant headache. Has taken advil when needed for HA    HPI Patient was seen today for ongoing concern.  Patient endorses constipation x 3 weeks with bloating and increased flatus.  Symptoms started after travel to [place], [place], and [place].   Pt denies eating or drinking any unusual foods or from roadside stands.  Pt states she is having BMs daily or qod, but having to strain.  Stools firm, pebble like.  Saw a few white specks in stools.  Denies anal pruritus, hematochezia, BRBPR, wt loss.  Patient drinking at least 2 L of water per day.  Has increased intake of vegetables and fiber.  Start taking a probiotic but has not noticed much improvement.  Past Medical History:  Diagnosis Date   Chicken pox     No Known Allergies  ROS General: Denies fever, chills, night sweats, changes in weight, changes in appetite HEENT: Denies headaches, ear pain, changes in vision, rhinorrhea, sore throat CV: Denies CP, palpitations, SOB, orthopnea Pulm: Denies SOB, cough, wheezing GI: Denies abdominal pain, nausea, vomiting, diarrhea +constipation, bloating, flatus GU: Denies dysuria, hematuria, frequency, vaginal discharge Msk: Denies muscle cramps, joint pains Neuro: Denies weakness, numbness, tingling Skin: Denies rashes, bruising Psych: Denies depression, anxiety, hallucinations     Objective:    Blood pressure 124/82, pulse 98, temperature 99.4 F (37.4 C), temperature source Oral, weight 124 lb 9.6 oz (56.5 kg), SpO2 99 %.  Gen. Pleasant, well-nourished, in no distress, normal affect   HEENT: /AT, face symmetric, conjunctiva clear, no scleral icterus, PERRLA, EOMI, nares  patent without drainage Lungs: no accessory muscle use Cardiovascular: RRR, no peripheral edema Abdomen: BS present, soft, NT/ND, no hepatosplenomegaly. Musculoskeletal: No deformities, no cyanosis or clubbing, normal tone Neuro:  A&Ox3, CN II-XII intact, normal gait Skin:  Warm, no lesions/ rash   Wt Readings from Last 3 Encounters:  10/06/21 124 lb 9.6 oz (56.5 kg)  05/20/21 119 lb (54 kg)  05/06/21 119 lb 9.6 oz (54.3 kg)    Lab Results  Component Value Date   WBC 4.6 10/19/2020   HGB 14.1 10/19/2020   HCT 41.5 10/19/2020   PLT 173.0 10/19/2020   GLUCOSE 79 10/19/2020   CHOL 203 (H) 10/19/2020   TRIG 151.0 (H) 10/19/2020   HDL 38.80 (L) 10/19/2020   LDLCALC 134 (H) 10/19/2020   ALT 11 10/19/2020   AST 14 10/19/2020   NA 133 (L) 10/19/2020   K 4.0 10/19/2020   CL 101 10/19/2020   CREATININE 0.64 10/19/2020   BUN 8 10/19/2020   CO2 22 10/19/2020   TSH 0.69 10/19/2020   HGBA1C 5.2 10/19/2020    Assessment/Plan:  Other constipation  -Discussed possible causes of symptoms -Continue increasing p.o. intake of water -Discussed starting Colace and senna -For continued symptoms obtain stool studies including O&P.  Patient given sterile container to collect specimen if needed. -Given handout and precautions. -GI f/u if needed - Plan: senna-docusate (SENOKOT-S) 8.6-50 MG tablet  F/u as needed  Grier Mitts, MD

## 2021-11-11 ENCOUNTER — Telehealth: Payer: Self-pay | Admitting: Family Medicine

## 2021-11-11 NOTE — Telephone Encounter (Signed)
Last ov 10/06/21 patient concerned her symptoms not improving, itching and now vaginal irritation. Pt seeking guidance as to whether you think this is pinwoms and what treatment to go forward

## 2021-11-12 ENCOUNTER — Encounter: Payer: Self-pay | Admitting: Family Medicine

## 2021-11-12 ENCOUNTER — Ambulatory Visit (INDEPENDENT_AMBULATORY_CARE_PROVIDER_SITE_OTHER): Payer: BC Managed Care – PPO | Admitting: Family Medicine

## 2021-11-12 VITALS — BP 120/86 | HR 94 | Temp 98.5°F | Wt 123.0 lb

## 2021-11-12 DIAGNOSIS — R140 Abdominal distension (gaseous): Secondary | ICD-10-CM | POA: Diagnosis not present

## 2021-11-12 DIAGNOSIS — B80 Enterobiasis: Secondary | ICD-10-CM

## 2021-11-12 MED ORDER — ALBENDAZOLE 200 MG PO TABS: ORAL_TABLET | ORAL | 0 refills | Status: DC

## 2021-11-12 NOTE — Progress Notes (Signed)
Subjective:    Patient ID: Julia Elliott, female    DOB: 10-30-1980, 41 y.o.   MRN: [id number]  Chief Complaint  Patient presents with   Hospitalization Follow-up    More bloating and gas than usual. Like an unusual feeling in stomach. Started having vaginal itching, burning sensation and just weird feeling all the time, not when urinating. Itchy uncomfortable feeling in rear, husband found something looks like a worm on toilet seat. Has noticed white string like things in toilet yesterday, and white specks in stool.    HPI Patient was seen today for follow-up on ongoing concern.  Patient seen 10/06/2021 for constipation after travel to [place], [place], and [place] 3 wks prior.  Pt with continued abdominal bloating, gassy feeling.  Had some loose stools today.  Also having anal pruritus, vaginal irritation-itching/burning.  Patient states a few days ago appeared to have white string-like things in it.  Patient states her husband noted in white, clearish, string-like worm on the toilet seat.   Patient and her husband are due to travel to [place] and [place] next week. Past Medical History:  Diagnosis Date   Chicken pox     No Known Allergies  ROS General: Denies fever, chills, night sweats, changes in weight, changes in appetite HEENT: Denies headaches, ear pain, changes in vision, rhinorrhea, sore throat CV: Denies CP, palpitations, SOB, orthopnea Pulm: Denies SOB, cough, wheezing GI: Denies abdominal pain, nausea, vomiting +diarrhea, constipation, bloating, gas GU: Denies dysuria, hematuria, frequency +vaginal irritation-itching/burning Msk: Denies muscle cramps, joint pains Neuro: Denies weakness, numbness, tingling Skin: Denies rashes, bruising Psych: Denies depression, anxiety, hallucinations     Objective:    Blood pressure 120/86, pulse 94, temperature 98.5 F (36.9 C), temperature source Oral, weight 123 lb (55.8 kg), SpO2 99 %.  Gen. Pleasant, well-nourished,  in no distress, normal affect   HEENT: /AT, face symmetric, conjunctiva clear, no scleral icterus, PERRLA, EOMI, nares patent without drainage Lungs: no accessory muscle use Cardiovascular: RRR, no peripheral edema Abdomen: BS present, soft, NT/ND Neuro:  A&Ox3, CN II-XII intact, normal gait Skin:  Warm, no lesions/ rash   Wt Readings from Last 3 Encounters:  11/12/21 123 lb (55.8 kg)  10/06/21 124 lb 9.6 oz (56.5 kg)  05/20/21 119 lb (54 kg)    Lab Results  Component Value Date   WBC 4.6 10/19/2020   HGB 14.1 10/19/2020   HCT 41.5 10/19/2020   PLT 173.0 10/19/2020   GLUCOSE 79 10/19/2020   CHOL 203 (H) 10/19/2020   TRIG 151.0 (H) 10/19/2020   HDL 38.80 (L) 10/19/2020   LDLCALC 134 (H) 10/19/2020   ALT 11 10/19/2020   AST 14 10/19/2020   NA 133 (L) 10/19/2020   K 4.0 10/19/2020   CL 101 10/19/2020   CREATININE 0.64 10/19/2020   BUN 8 10/19/2020   CO2 22 10/19/2020   TSH 0.69 10/19/2020   HGBA1C 5.2 10/19/2020    Assessment/Plan:  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  As, pt with continued symptoms and seen whitish string-like things in stool we will start treatment with antiemetic.  Mebendazole 400 mg sent to pharmacy.  Patient to take 1 tab now and repeat dose in 2 weeks.  Previously given sterile container for O&P, if able to provide stool sample can bring back to clinic prior to starting medication.  Given precautions.  F/u as needed  [employer]  Volanda Napoleon, MD

## 2021-11-12 NOTE — Telephone Encounter (Signed)
Pt is scheduled to come in today.

## 2021-12-03 ENCOUNTER — Ambulatory Visit (INDEPENDENT_AMBULATORY_CARE_PROVIDER_SITE_OTHER): Payer: BC Managed Care – PPO

## 2021-12-03 DIAGNOSIS — Z23 Encounter for immunization: Secondary | ICD-10-CM

## 2021-12-31 ENCOUNTER — Ambulatory Visit (INDEPENDENT_AMBULATORY_CARE_PROVIDER_SITE_OTHER): Payer: BC Managed Care – PPO | Admitting: Family Medicine

## 2021-12-31 ENCOUNTER — Encounter: Payer: Self-pay | Admitting: Family Medicine

## 2021-12-31 VITALS — BP 118/78 | HR 90 | Temp 98.6°F | Wt 122.5 lb

## 2021-12-31 DIAGNOSIS — R195 Other fecal abnormalities: Secondary | ICD-10-CM | POA: Diagnosis not present

## 2021-12-31 MED ORDER — ALBENDAZOLE 200 MG PO TABS: ORAL_TABLET | ORAL | 0 refills | Status: DC

## 2021-12-31 NOTE — Progress Notes (Signed)
   Subjective:    Patient ID: Julia Elliott, female    DOB: 05/03/1980, 41 y.o.   MRN: [id number]  HPI Here for what she thinks may be pinworms. She had a similar episode in September when she returned from a trip to [place]. At that time she had cramps and loose stools, and she had white string in the stools. She was treated with Albendazole and everything resolved. This time she returned several days ago from a trip to [place]. While there she developed mild upper abdominal cramps, and she had diarrhea for a few days. Then she went a day with no stools, and the past 2 days she has had firm stools that are covered with a whitish mucus. No fever or nausea. She has not seen anything in the stool this time, but she says this is almost the same scenario as what she had in September. Her appetite is normal.    Review of Systems  Constitutional: Negative.   Respiratory: Negative.    Cardiovascular: Negative.   Gastrointestinal:  Positive for abdominal pain and diarrhea. Negative for abdominal distention, anal bleeding, blood in stool, constipation, nausea, rectal pain and vomiting.  Genitourinary: Negative.        Objective:   Physical Exam Constitutional:      Appearance: Normal appearance. She is not ill-appearing.  Cardiovascular:     Rate and Rhythm: Normal rate and regular rhythm.     Pulses: Normal pulses.     Heart sounds: Normal heart sounds.  Pulmonary:     Effort: Pulmonary effort is normal.     Breath sounds: Normal breath sounds.  Abdominal:     General: Abdomen is flat. Bowel sounds are normal. There is no distension.     Palpations: Abdomen is soft. There is no mass.     Tenderness: There is no guarding or rebound.     Hernia: No hernia is present.     Comments: Slightly tender in the epigastrium.   Neurological:     Mental Status: She is alert.           Assessment & Plan:  It is unclear if this is another case of pinworms or not. We agreed to supply her  with a RX for Albendazole again, but she will wait to get it filled. She will observe her stools over the weekend and she will use her judgement about taking the pills. She will report back to [place] either way next week.  Alysia Penna, MD

## 2022-02-09 ENCOUNTER — Ambulatory Visit (INDEPENDENT_AMBULATORY_CARE_PROVIDER_SITE_OTHER): Payer: BC Managed Care – PPO | Admitting: Family Medicine

## 2022-02-09 ENCOUNTER — Encounter: Payer: Self-pay | Admitting: Family Medicine

## 2022-02-09 VITALS — BP 112/78 | HR 94 | Temp 98.1°F | Ht 63.5 in | Wt 122.8 lb

## 2022-02-09 DIAGNOSIS — E782 Mixed hyperlipidemia: Secondary | ICD-10-CM | POA: Diagnosis not present

## 2022-02-09 DIAGNOSIS — Z0000 Encounter for general adult medical examination without abnormal findings: Secondary | ICD-10-CM | POA: Diagnosis not present

## 2022-02-09 LAB — CBC WITH DIFFERENTIAL/PLATELET
Basophils Absolute: 0 10*3/uL (ref 0.0–0.1)
Basophils Relative: 0.4 % (ref 0.0–3.0)
Eosinophils Absolute: 0 10*3/uL (ref 0.0–0.7)
Eosinophils Relative: 0.6 % (ref 0.0–5.0)
HCT: 39.9 % (ref 36.0–46.0)
Hemoglobin: 13.6 g/dL (ref 12.0–15.0)
Lymphocytes Relative: 26.4 % (ref 12.0–46.0)
Lymphs Abs: 1.6 10*3/uL (ref 0.7–4.0)
MCHC: 34 g/dL (ref 30.0–36.0)
MCV: 94 fl (ref 78.0–100.0)
Monocytes Absolute: 0.3 10*3/uL (ref 0.1–1.0)
Monocytes Relative: 4.4 % (ref 3.0–12.0)
Neutro Abs: 4.1 10*3/uL (ref 1.4–7.7)
Neutrophils Relative %: 68.2 % (ref 43.0–77.0)
Platelets: 172 10*3/uL (ref 150.0–400.0)
RBC: 4.25 Mil/uL (ref 3.87–5.11)
RDW: 13.4 % (ref 11.5–15.5)
WBC: 6.1 10*3/uL (ref 4.0–10.5)

## 2022-02-09 LAB — BASIC METABOLIC PANEL
BUN: 8 mg/dL (ref 6–23)
CO2: 24 mEq/L (ref 19–32)
Calcium: 9.1 mg/dL (ref 8.4–10.5)
Chloride: 102 mEq/L (ref 96–112)
Creatinine, Ser: 0.55 mg/dL (ref 0.40–1.20)
GFR: 115.47 mL/min (ref >60.00)
Glucose, Bld: 85 mg/dL (ref 70–99)
Potassium: 4.1 mEq/L (ref 3.5–5.1)
Sodium: 135 mEq/L (ref 135–145)

## 2022-02-09 LAB — LIPID PANEL
Cholesterol: 221 mg/dL — ABNORMAL HIGH (ref 0–200)
HDL: 38 mg/dL — ABNORMAL LOW (ref >39.00)
LDL Cholesterol: 143 mg/dL — ABNORMAL HIGH (ref 0–99)
NonHDL: 182.74
Total CHOL/HDL Ratio: 6
Triglycerides: 197 mg/dL — ABNORMAL HIGH (ref 0.0–149.0)
VLDL: 39.4 mg/dL (ref 0.0–40.0)

## 2022-02-09 LAB — HEMOGLOBIN A1C: Hgb A1c MFr Bld: 5.3 % (ref 4.6–6.5)

## 2022-02-09 NOTE — Progress Notes (Signed)
Subjective:     Julia Elliott is a 41 y.o. female and is here for a comprehensive physical exam. The patient reports no problems.  Social History   Socioeconomic History   Marital status: Married    Spouse name: Not on file   Number of children: Not on file   Years of education: Not on file   Highest education level: Master's degree (e.g., MA, MS, MEng, MEd, MSW, MBA)  Occupational History   Not on file  Tobacco Use   Smoking status: Never   Smokeless tobacco: Never  Vaping Use   Vaping Use: Never used  Substance and Sexual Activity   Alcohol use: Yes    Comment: once-twice a week   Drug use: Never   Sexual activity: Yes  Other Topics Concern   Not on file  Social History Narrative   Not on file   Social Determinants of Health   Financial Resource Strain: Low Risk  (10/05/2021)   Overall Financial Resource Strain (CARDIA)    Difficulty of Paying Living Expenses: Not hard at all  Food Insecurity: No Food Insecurity (10/05/2021)   Hunger Vital Sign    Worried About Running Out of Food in the Last Year: Never true    [place] in the Last Year: Never true  Transportation Needs: No Transportation Needs (10/05/2021)   PRAPARE - Hydrologist (Medical): No    Lack of Transportation (Non-Medical): No  Physical Activity: Insufficiently Active (10/05/2021)   Exercise Vital Sign    Days of Exercise per Week: 4 days    Minutes of Exercise per Session: 30 min  Stress: Stress Concern Present (10/05/2021)   [place]    Feeling of Stress : To some extent  Social Connections: Moderately Isolated (10/05/2021)   Social Connection and Isolation Panel [NHANES]    Frequency of Communication with Friends and Family: More than three times a week    Frequency of Social Gatherings with Friends and Family: Twice a week    Attends Religious Services: Never    Marine scientist or  Organizations: No    Attends Music therapist: Not on file    Marital Status: Married  Human resources officer Violence: Not on file   Health Maintenance  Topic Date Due   HIV Screening  Never done   Hepatitis C Screening  Never done   DTaP/Tdap/Td (2 - Td or Tdap) 04/28/2019   COVID-19 Vaccine (4 - 2023-24 season) 02/25/2022 (Originally 10/29/2021)   PAP SMEAR-Modifier  09/27/2022   INFLUENZA VACCINE  Completed   HPV VACCINES  Aged Out    The following portions of the patient's history were reviewed and updated as appropriate: allergies, current medications, past family history, past medical history, past social history, past surgical history, and problem list.  Review of Systems Pertinent items noted in HPI and remainder of comprehensive ROS otherwise negative.   Objective:    BP 112/78 (BP Location: Left Arm, Patient Position: Sitting, Cuff Size: Normal)   Pulse 94   Temp 98.1 F (36.7 C) (Oral)   Ht 5' 3.5" (1.613 m)   Wt 122 lb 12.8 oz (55.7 kg)   SpO2 98%   BMI 21.41 kg/m  General appearance: alert, cooperative, and no distress Head: Normocephalic, without obvious abnormality, atraumatic Eyes: conjunctivae/corneas clear. PERRL, EOM's intact. Fundi benign. Ears: normal TM's and external ear canals both ears Nose: Nares normal. Septum midline.  Mucosa normal. No drainage or sinus tenderness. Throat: lips, mucosa, and tongue normal; teeth and gums normal Neck: no adenopathy, no carotid bruit, no JVD, supple, symmetrical, trachea midline, and thyroid not enlarged, symmetric, no tenderness/mass/nodules Lungs: clear to auscultation bilaterally Heart: regular rate and rhythm, S1, S2 normal, no murmur, click, rub or gallop Abdomen: soft, non-tender; bowel sounds normal; no masses,  no organomegaly Extremities: extremities normal, atraumatic, no cyanosis or edema Pulses: 2+ and symmetric Skin: Skin color, texture, turgor normal. No rashes or lesions Lymph nodes: Cervical,  supraclavicular, and axillary nodes normal. Neurologic: Alert and oriented X 3, normal strength and tone. Normal symmetric reflexes. Normal coordination and gait    Assessment:    Healthy female exam.      Plan:    Anticipatory guidance given including wearing seatbelts, smoke detectors in the home, increasing physical activity, increasing p.o. intake of water and vegetables. -labs -immunizations reviewed and up to date -pap up to date, done 09/27/19, due 2026 -next CPE in 88yrSee After Visit Summary for Counseling Recommendations   Well adult exam - Plan: CBC with Differential/Platelet, Basic metabolic panel, Hemoglobin A1c, Lipid panel  Mixed hyperlipidemia - Plan: Basic metabolic panel, Lipid panel   F/u prn  SGrier Mitts MD

## 2022-03-15 DIAGNOSIS — R197 Diarrhea, unspecified: Secondary | ICD-10-CM | POA: Diagnosis not present

## 2022-03-15 DIAGNOSIS — R112 Nausea with vomiting, unspecified: Secondary | ICD-10-CM | POA: Diagnosis not present

## 2022-04-19 ENCOUNTER — Telehealth: Payer: Self-pay | Admitting: Family Medicine

## 2022-04-19 DIAGNOSIS — M722 Plantar fascial fibromatosis: Secondary | ICD-10-CM

## 2022-04-19 NOTE — Telephone Encounter (Signed)
Pt was last seen on 02/09/22 for a CPE.  Pt is requesting a referral to see a Podiatrist to address her plantars fascitis, as discussed during previous  appointment.  Please advise.  [phone] (M)

## 2022-04-20 NOTE — Telephone Encounter (Signed)
Please advise 

## 2022-04-22 NOTE — Telephone Encounter (Signed)
Referral placed.

## 2022-04-22 NOTE — Telephone Encounter (Signed)
If pt returns call please advised that referral for podiatry has been placed. Pt can expect a call from podiatrist to schedule soon.

## 2022-04-22 NOTE — Telephone Encounter (Signed)
Left message to return phone call.

## 2022-04-22 NOTE — Telephone Encounter (Signed)
That's fine

## 2022-04-22 NOTE — Addendum Note (Signed)
Addended by: Franco Collet on: 04/22/2022 02:39 PM   Modules accepted: Orders

## 2022-04-27 ENCOUNTER — Encounter: Payer: Self-pay | Admitting: Podiatry

## 2022-04-27 ENCOUNTER — Ambulatory Visit (INDEPENDENT_AMBULATORY_CARE_PROVIDER_SITE_OTHER): Payer: BC Managed Care – PPO

## 2022-04-27 ENCOUNTER — Ambulatory Visit (INDEPENDENT_AMBULATORY_CARE_PROVIDER_SITE_OTHER): Payer: BC Managed Care – PPO | Admitting: Podiatry

## 2022-04-27 DIAGNOSIS — M62462 Contracture of muscle, left lower leg: Secondary | ICD-10-CM

## 2022-04-27 DIAGNOSIS — Q6671 Congenital pes cavus, right foot: Secondary | ICD-10-CM | POA: Diagnosis not present

## 2022-04-27 DIAGNOSIS — M722 Plantar fascial fibromatosis: Secondary | ICD-10-CM

## 2022-04-27 DIAGNOSIS — M62461 Contracture of muscle, right lower leg: Secondary | ICD-10-CM | POA: Diagnosis not present

## 2022-04-27 DIAGNOSIS — Q6672 Congenital pes cavus, left foot: Secondary | ICD-10-CM

## 2022-04-27 MED ORDER — MELOXICAM 15 MG PO TABS: 15.0000 mg | ORAL_TABLET | Freq: Every day | ORAL | 3 refills | Status: DC

## 2022-04-27 NOTE — Patient Instructions (Signed)
Look for Tuli's Heavy Duty Heel Cups  Plantar Fasciitis (Heel Spur Syndrome) with Rehab The plantar fascia is a fibrous, ligament-like, soft-tissue structure that spans the bottom of the foot. Plantar fasciitis is a condition that causes pain in the foot due to inflammation of the tissue. SYMPTOMS  Pain and tenderness on the underneath side of the foot. Pain that worsens with standing or walking. CAUSES  Plantar fasciitis is caused by irritation and injury to the plantar fascia on the underneath side of the foot. Common mechanisms of injury include: Direct trauma to bottom of the foot. Damage to a small nerve that runs under the foot where the main fascia attaches to the heel bone. Stress placed on the plantar fascia due to bone spurs. RISK INCREASES WITH:  Activities that place stress on the plantar fascia (running, jumping, pivoting, or cutting). Poor strength and flexibility. Improperly fitted shoes. Tight calf muscles. Flat feet. Failure to warm-up properly before activity. Obesity. PREVENTION Warm up and stretch properly before activity. Allow for adequate recovery between workouts. Maintain physical fitness: Strength, flexibility, and endurance. Cardiovascular fitness. Maintain a health body weight. Avoid stress on the plantar fascia. Wear properly fitted shoes, including arch supports for individuals who have flat feet.  PROGNOSIS  If treated properly, then the symptoms of plantar fasciitis usually resolve without surgery. However, occasionally surgery is necessary.  RELATED COMPLICATIONS  Recurrent symptoms that may result in a chronic condition. Problems of the lower back that are caused by compensating for the injury, such as limping. Pain or weakness of the foot during push-off following surgery. Chronic inflammation, scarring, and partial or complete fascia tear, occurring more often from repeated injections.  TREATMENT  Treatment initially involves the use of ice  and medication to help reduce pain and inflammation. The use of strengthening and stretching exercises may help reduce pain with activity, especially stretches of the Achilles tendon. These exercises may be performed at home or with a therapist. Your caregiver may recommend that you use heel cups of arch supports to help reduce stress on the plantar fascia. Occasionally, corticosteroid injections are given to reduce inflammation. If symptoms persist for greater than 6 months despite non-surgical (conservative), then surgery may be recommended.   MEDICATION  If pain medication is necessary, then nonsteroidal anti-inflammatory medications, such as aspirin and ibuprofen, or other minor pain relievers, such as acetaminophen, are often recommended. Do not take pain medication within 7 days before surgery. Prescription pain relievers may be given if deemed necessary by your caregiver. Use only as directed and only as much as you need. Corticosteroid injections may be given by your caregiver. These injections should be reserved for the most serious cases, because they may only be given a certain number of times.  HEAT AND COLD Cold treatment (icing) relieves pain and reduces inflammation. Cold treatment should be applied for 10 to 15 minutes every 2 to 3 hours for inflammation and pain and immediately after any activity that aggravates your symptoms. Use ice packs or massage the area with a piece of ice (ice massage). Heat treatment may be used prior to performing the stretching and strengthening activities prescribed by your caregiver, physical therapist, or athletic trainer. Use a heat pack or soak the injury in warm water.  SEEK IMMEDIATE MEDICAL CARE IF: Treatment seems to offer no benefit, or the condition worsens. Any medications produce adverse side effects.  EXERCISES- RANGE OF MOTION (ROM) AND STRETCHING EXERCISES - Plantar Fasciitis (Heel Spur Syndrome) These exercises may help you  when  beginning to rehabilitate your injury. Your symptoms may resolve with or without further involvement from your physician, physical therapist or athletic trainer. While completing these exercises, remember:  Restoring tissue flexibility helps normal motion to return to the joints. This allows healthier, less painful movement and activity. An effective stretch should be held for at least 30 seconds. A stretch should never be painful. You should only feel a gentle lengthening or release in the stretched tissue.  RANGE OF MOTION - Toe Extension, Flexion Sit with your right / left leg crossed over your opposite knee. Grasp your toes and gently pull them back toward the top of your foot. You should feel a stretch on the bottom of your toes and/or foot. Hold this stretch for 10 seconds. Now, gently pull your toes toward the bottom of your foot. You should feel a stretch on the top of your toes and or foot. Hold this stretch for 10 seconds. Repeat  times. Complete this stretch 3 times per day.   RANGE OF MOTION - Ankle Dorsiflexion, Active Assisted Remove shoes and sit on a chair that is preferably not on a carpeted surface. Place right / left foot under knee. Extend your opposite leg for support. Keeping your heel down, slide your right / left foot back toward the chair until you feel a stretch at your ankle or calf. If you do not feel a stretch, slide your bottom forward to the edge of the chair, while still keeping your heel down. Hold this stretch for 10 seconds. Repeat 3 times. Complete this stretch 2 times per day.   STRETCH  Gastroc, Standing Place hands on wall. Extend right / left leg, keeping the front knee somewhat bent. Slightly point your toes inward on your back foot. Keeping your right / left heel on the floor and your knee straight, shift your weight toward the wall, not allowing your back to arch. You should feel a gentle stretch in the right / left calf. Hold this position for 10  seconds. Repeat 3 times. Complete this stretch 2 times per day.  STRETCH  Soleus, Standing Place hands on wall. Extend right / left leg, keeping the other knee somewhat bent. Slightly point your toes inward on your back foot. Keep your right / left heel on the floor, bend your back knee, and slightly shift your weight over the back leg so that you feel a gentle stretch deep in your back calf. Hold this position for 10 seconds. Repeat 3 times. Complete this stretch 2 times per day.  STRETCH  Gastrocsoleus, Standing  Note: This exercise can place a lot of stress on your foot and ankle. Please complete this exercise only if specifically instructed by your caregiver.  Place the ball of your right / left foot on a step, keeping your other foot firmly on the same step. Hold on to the wall or a rail for balance. Slowly lift your other foot, allowing your body weight to press your heel down over the edge of the step. You should feel a stretch in your right / left calf. Hold this position for 10 seconds. Repeat this exercise with a slight bend in your right / left knee. Repeat 3 times. Complete this stretch 2 times per day.   STRENGTHENING EXERCISES - Plantar Fasciitis (Heel Spur Syndrome)  These exercises may help you when beginning to rehabilitate your injury. They may resolve your symptoms with or without further involvement from your physician, physical therapist or athletic trainer.  While completing these exercises, remember:  Muscles can gain both the endurance and the strength needed for everyday activities through controlled exercises. Complete these exercises as instructed by your physician, physical therapist or athletic trainer. Progress the resistance and repetitions only as guided.  STRENGTH - Towel Curls Sit in a chair positioned on a non-carpeted surface. Place your foot on a towel, keeping your heel on the floor. Pull the towel toward your heel by only curling your toes. Keep your  heel on the floor. Repeat 3 times. Complete this exercise 2 times per day.  STRENGTH - Ankle Inversion Secure one end of a rubber exercise band/tubing to a fixed object (table, pole). Loop the other end around your foot just before your toes. Place your fists between your knees. This will focus your strengthening at your ankle. Slowly, pull your big toe up and in, making sure the band/tubing is positioned to resist the entire motion. Hold this position for 10 seconds. Have your muscles resist the band/tubing as it slowly pulls your foot back to the starting position. Repeat 3 times. Complete this exercises 2 times per day.  Document Released: 02/14/2005 Document Revised: 05/09/2011 Document Reviewed: 05/29/2008 [facility] Patient Information 2014 [place], [place].

## 2022-04-28 ENCOUNTER — Encounter: Payer: Self-pay | Admitting: Podiatry

## 2022-04-28 NOTE — Progress Notes (Signed)
  Subjective:  Patient ID: ARLYSS CHRISP, female    DOB: 31-Jan-1981,  MRN: [id number]  Chief Complaint  Patient presents with   Plantar Fasciitis    NP  bilateral heel and arch pain    42 y.o. female presents with the above complaint. History confirmed with patient.  Does not recall any injury but has been going on for about a year now  Objective:  Physical Exam: warm, good capillary refill, no trophic changes or ulcerative lesions, normal DP and PT pulses, and normal sensory exam.  Bilaterally she has significant pes cavus foot type, no pain within the plantar fascia or at its insertion but is in the plantar heel that is mild to moderate, significant gastrocnemius equinus bilaterally  Radiographs: Multiple views x-ray of both feet: no fracture, dislocation, swelling or degenerative changes noted and she has a pes cavus foot type Assessment:   1. Plantar fasciitis, bilateral   2. Gastrocnemius equinus of left lower extremity   3. Gastrocnemius equinus of right lower extremity   4. Pes cavus of both feet      Plan:  Patient was evaluated and treated and all questions answered.  Suspect she is having bony heel pain secondary to her pes cavus foot type and increased pressure.  Equinus is a factor here as well and I recommended home physical therapy exercises for this as well as plantar fasciitis type exercises but her plantar fasciitis symptoms are not classic.  Recommended offloading with Tuli's heavy-duty heel cups use of anti-inflammatory with meloxicam.  Rx sent to pharmacy.  I will see her back in 1 month for follow-up.  Long-term may benefit from custom molded orthoses but she has a decent pair of prefab OTC power step insert currently  Return in about 1 month (around 05/26/2022) for recheck heel pain .

## 2022-05-28 DIAGNOSIS — H6502 Acute serous otitis media, left ear: Secondary | ICD-10-CM | POA: Diagnosis not present

## 2022-05-28 DIAGNOSIS — J0110 Acute frontal sinusitis, unspecified: Secondary | ICD-10-CM | POA: Diagnosis not present

## 2022-05-28 DIAGNOSIS — J029 Acute pharyngitis, unspecified: Secondary | ICD-10-CM | POA: Diagnosis not present

## 2022-05-31 ENCOUNTER — Ambulatory Visit (INDEPENDENT_AMBULATORY_CARE_PROVIDER_SITE_OTHER): Payer: BC Managed Care – PPO | Admitting: Podiatry

## 2022-05-31 DIAGNOSIS — M722 Plantar fascial fibromatosis: Secondary | ICD-10-CM | POA: Diagnosis not present

## 2022-06-05 NOTE — Progress Notes (Signed)
  Subjective:  Patient ID: Julia Elliott, female    DOB: 07/05/1980,  MRN: [id number]  Chief Complaint  Patient presents with   Plantar Fasciitis    Follow up heel pain - doing much better    42 y.o. female presents with the above complaint. History confirmed with patient.  Doing much better not having any pain  Objective:  Physical Exam: warm, good capillary refill, no trophic changes or ulcerative lesions, normal DP and PT pulses, and normal sensory exam.  Improvement in equinus, she has no pain to palpation today  Radiographs: Multiple views x-ray of both feet: no fracture, dislocation, swelling or degenerative changes noted and she has a pes cavus foot type Assessment:   1. Plantar fasciitis, bilateral      Plan:  Patient was evaluated and treated and all questions answered.  Much better.  We discussed possibility of recurrence and continue with regular stretching long-term.  She will return to see me as needed if this returns or worsens.  Return if symptoms worsen or fail to improve.

## 2022-06-13 ENCOUNTER — Encounter: Payer: Self-pay | Admitting: Family Medicine

## 2022-06-13 ENCOUNTER — Ambulatory Visit (INDEPENDENT_AMBULATORY_CARE_PROVIDER_SITE_OTHER): Payer: BC Managed Care – PPO | Admitting: Family Medicine

## 2022-06-13 VITALS — BP 128/80 | HR 92 | Temp 99.0°F | Wt 122.8 lb

## 2022-06-13 DIAGNOSIS — B379 Candidiasis, unspecified: Secondary | ICD-10-CM | POA: Diagnosis not present

## 2022-06-13 DIAGNOSIS — H6593 Unspecified nonsuppurative otitis media, bilateral: Secondary | ICD-10-CM | POA: Diagnosis not present

## 2022-06-13 DIAGNOSIS — T3695XA Adverse effect of unspecified systemic antibiotic, initial encounter: Secondary | ICD-10-CM

## 2022-06-13 DIAGNOSIS — J019 Acute sinusitis, unspecified: Secondary | ICD-10-CM

## 2022-06-13 MED ORDER — DOXYCYCLINE HYCLATE 100 MG PO TABS: 100.0000 mg | ORAL_TABLET | Freq: Two times a day (BID) | ORAL | 0 refills | Status: AC

## 2022-06-13 MED ORDER — FLUCONAZOLE 150 MG PO TABS: ORAL_TABLET | ORAL | 0 refills | Status: DC

## 2022-06-13 NOTE — Patient Instructions (Addendum)
I have included some information about ear effusions, unfortunately the informational in the system also talks about otitis media (ear infection) which you currently do not have.  Prescription for doxycycline 1 pill twice a day for the next 10 days was sent to your pharmacy in addition to Diflucan a pill for yeast.

## 2022-06-13 NOTE — Progress Notes (Signed)
   Established Patient Office Visit   Subjective  Patient ID: Julia Elliott, female    DOB: 12-01-1980  Age: 42 y.o. MRN: [id number]  Chief Complaint  Patient presents with   Sinus Problem    Had a cold, got worse. Started feeling sore throat and pressure, was out of country. Easter weekend talked to telehealth, was given augmentin, but is still feeling pressure behind eyes, and ear fullness in left ear. Pressure is on left side of face.     Patient is a 42 year old female seen for ongoing concerns.  Patient states she developed cold-like symptoms 5 weeks ago while on a trip to [place].  Symptoms lasted 10 days, progressing into left-sided facial congestion, painful "angry" sore throat with white patches.  Patient started a 5-day course of amoxicillin which initially symptoms, but then they returned.  Over Easter weekend patient seen virtually, given 7 days of Augmentin.  Symptoms improved some but patient continues to have lingering pressure in face, eyes, left ear.  Left ear feels muffled.  Having intermittent dental pain/pressure.  Taking OTC antihistamine, Flonase, and using saline nasal rinse.  Sinus Problem      ROS Negative unless stated above    Objective:     BP 128/80 (BP Location: Left Arm, Patient Position: Sitting, Cuff Size: Normal)   Pulse 92   Temp 99 F (37.2 C) (Oral)   Wt 122 lb 12.8 oz (55.7 kg)   SpO2 98%   BMI 21.41 kg/m    Physical Exam Constitutional:      General: She is not in acute distress.    Appearance: Normal appearance.  HENT:     Head: Normocephalic and atraumatic.     Right Ear: A middle ear effusion is present.     Left Ear: A middle ear effusion is present.     Ears:     Comments: Bilateral TMs full with effusion.  No erythema or suppurative fluid.    Nose: Nose normal.     Mouth/Throat:     Mouth: Mucous membranes are moist.  Cardiovascular:     Rate and Rhythm: Normal rate and regular rhythm.     Heart sounds: Normal heart  sounds. No murmur heard.    No gallop.  Pulmonary:     Effort: Pulmonary effort is normal. No respiratory distress.     Breath sounds: Normal breath sounds. No wheezing, rhonchi or rales.  Skin:    General: Skin is warm and dry.  Neurological:     Mental Status: She is alert and oriented to person, place, and time.      No results found for any visits on 06/13/22.    Assessment & Plan:  Subacute sinusitis, unspecified location -     Doxycycline Hyclate; Take 1 tablet (100 mg total) by mouth 2 (two) times daily for 10 days.  Dispense: 20 tablet; Refill: 0  Middle ear effusion, bilateral  Antibiotic-induced yeast infection -     Fluconazole; Take 1 tab now.  Repeat dose in 3 days if needed.  Dispense: 2 tablet; Refill: 0  Failed ABX courses of amoxicillin and Augmentin likely 2/2 inadequate duration treatment(s).  Start doxycycline for sinusitis.  Patient likely had strep pharyngitis when symptoms initially started.  Continue OTC antihistamine and nasal sprays.  Diflucan for possible antibiotic induced yeast infection.  Given strict precautions.  Return if symptoms worsen or fail to improve.   [name], MD

## 2022-06-20 ENCOUNTER — Encounter: Payer: Self-pay | Admitting: Family Medicine

## 2022-06-20 ENCOUNTER — Ambulatory Visit (INDEPENDENT_AMBULATORY_CARE_PROVIDER_SITE_OTHER): Payer: BC Managed Care – PPO | Admitting: Family Medicine

## 2022-06-20 VITALS — BP 110/84 | HR 87 | Temp 98.7°F | Ht 63.5 in | Wt 123.2 lb

## 2022-06-20 DIAGNOSIS — J0100 Acute maxillary sinusitis, unspecified: Secondary | ICD-10-CM | POA: Diagnosis not present

## 2022-06-20 MED ORDER — AZITHROMYCIN 250 MG PO TABS: ORAL_TABLET | ORAL | 0 refills | Status: AC

## 2022-06-20 NOTE — Progress Notes (Signed)
Established Patient Office Visit   Subjective  Patient ID: Julia Elliott, female    DOB: 1981-02-18  Age: 42 y.o. MRN: [id number]  Chief Complaint  Patient presents with   Follow-up    On sinus issues. Pt reports she is on 6th day of Doxycycline. Not getting better. Feels more congestion on Friday. More pressure on L side face, L eye.     Patient is a 42 year old female who presents for follow-up on ongoing concern.  Patient with continued sinus symptoms x 6 weeks.  Symptoms initially started while out of the country in [place].  Patient took 5 days of amoxicillin, and seen at [facility] for continued symptoms.  Given Augmentin x 7 days.  Patient seen on 4/15 by this provider given doxycycline.  On day 6 of Doxy, feels like symptoms are worse.  Having continued left-sided facial pressure, congestion.  Difficult to sleep at night due to discomfort.    Patient Active Problem List   Diagnosis Date Noted   Right tubo-ovarian mass 11/07/2019   Endometrial polyp 11/07/2019   Elevated CA-125 11/07/2019   Past Surgical History:  Procedure Laterality Date   CYST EXCISION N/A    neck   HYSTEROSCOPY WITH D & C N/A 01/14/2020   Procedure: DILATATION AND CURETTAGE /HYSTEROSCOPY;  Surgeon: Adolphus Birchwood, MD;  Location: [facility];  Service: Gynecology;  Laterality: N/A;   ROBOTIC ASSISTED SALPINGO OOPHERECTOMY N/A 01/14/2020   Procedure: XI ROBOTIC ASSISTED RIGHT SALPINGO-OOPHORECTOMY, LEFT OVARIAN CYSTECTOMY, LYSIS OF ADHESIONS;  Surgeon: Adolphus Birchwood, MD;  Location: [facility];  Service: Gynecology;  Laterality: N/A;   WISDOM TOOTH EXTRACTION     Social History   Tobacco Use   Smoking status: Never   Smokeless tobacco: Never  Vaping Use   Vaping Use: Never used  Substance Use Topics   Alcohol use: Yes    Comment: once-twice a week   Drug use: Never   Family History  Problem Relation Age of Onset   Hypertension Father    Hyperlipidemia Father    No Known Allergies    ROS Negative unless  stated above    Objective:     BP 110/84 (BP Location: Right Arm, Patient Position: Sitting, Cuff Size: Normal)   Pulse 87   Temp 98.7 F (37.1 C) (Oral)   Ht 5' 3.5" (1.613 m)   Wt 123 lb 3.2 oz (55.9 kg)   SpO2 100%   BMI 21.48 kg/m    Physical Exam Constitutional:      General: She is not in acute distress.    Appearance: Normal appearance.  HENT:     Head: Normocephalic and atraumatic.     Ears:     Comments: Bilateral TMs full.  Left TM with effusion.  No erythema.    Nose: Congestion present.     Right Turbinates: Enlarged and swollen.     Left Turbinates: Enlarged and swollen.     Right Sinus: Maxillary sinus tenderness and frontal sinus tenderness present.     Left Sinus: Maxillary sinus tenderness and frontal sinus tenderness present.     Mouth/Throat:     Mouth: Mucous membranes are moist.  Eyes:     Extraocular Movements: Extraocular movements intact.     Conjunctiva/sclera: Conjunctivae normal.  Cardiovascular:     Rate and Rhythm: Normal rate and regular rhythm.     Heart sounds: Normal heart sounds. No murmur heard.    No gallop.  Pulmonary:     Effort: Pulmonary effort is  normal. No respiratory distress.     Breath sounds: Normal breath sounds. No wheezing, rhonchi or rales.  Skin:    General: Skin is warm and dry.  Neurological:     Mental Status: She is alert and oriented to person, place, and time.      No results found for any visits on 06/20/22.    Assessment & Plan:  Subacute maxillary sinusitis -     Ambulatory referral to ENT -     CT MAXILLOFACIAL W CONTRAST; Future -     Azithromycin; Take 2 tablets on day 1, then 1 tablet daily on days 2 through 5  Dispense: 6 tablet; Refill: 0   Given continued sinus symptoms x 6 wks, d/c doxy.  Start Azithro.  Consider nasal culture.  CT sinuses.  Referral to ENT.  Return if symptoms worsen or fail to improve.   [name], MD

## 2022-06-23 ENCOUNTER — Telehealth: Payer: Self-pay | Admitting: Family Medicine

## 2022-06-23 NOTE — Telephone Encounter (Signed)
Pt was last seen by MD on 06/20/22.  Pt called to FU on the ENT referral she requested.

## 2022-06-27 NOTE — Telephone Encounter (Signed)
Patient leaving for a trip on Friday requesting a refill azithromycin (ZITHROMAX) 250 MG tablet just in case her symptoms return while traveling

## 2022-06-28 ENCOUNTER — Ambulatory Visit
Admission: RE | Admit: 2022-06-28 | Discharge: 2022-06-28 | Disposition: A | Discharge status: Home / Self Care | Payer: BC Managed Care – PPO | Source: Ambulatory Visit | Attending: Family Medicine | Admitting: Family Medicine

## 2022-06-28 DIAGNOSIS — J0100 Acute maxillary sinusitis, unspecified: Secondary | ICD-10-CM | POA: Diagnosis not present

## 2022-06-28 MED ORDER — IOPAMIDOL (ISOVUE-300) INJECTION 61%
75.0000 mL | Freq: Once | INTRAVENOUS | Status: AC | PRN
  Administered 2022-06-28: 75 mL via INTRAVENOUS

## 2022-06-29 ENCOUNTER — Other Ambulatory Visit: Payer: Self-pay | Admitting: Family Medicine

## 2022-06-29 DIAGNOSIS — R69 Illness, unspecified: Secondary | ICD-10-CM

## 2022-06-29 MED ORDER — AZITHROMYCIN 250 MG PO TABS: ORAL_TABLET | ORAL | 0 refills | Status: AC

## 2022-06-29 NOTE — Telephone Encounter (Signed)
Rx sent 

## 2022-07-01 NOTE — Telephone Encounter (Signed)
Left a detailed message on vm informing the patient rx was sent

## 2022-07-11 NOTE — Telephone Encounter (Signed)
Resent referral  

## 2022-07-26 ENCOUNTER — Other Ambulatory Visit: Payer: BC Managed Care – PPO

## 2022-08-02 DIAGNOSIS — J019 Acute sinusitis, unspecified: Secondary | ICD-10-CM | POA: Insufficient documentation

## 2022-08-02 DIAGNOSIS — J329 Chronic sinusitis, unspecified: Secondary | ICD-10-CM | POA: Diagnosis not present

## 2022-08-02 DIAGNOSIS — J320 Chronic maxillary sinusitis: Secondary | ICD-10-CM | POA: Insufficient documentation

## 2022-08-08 ENCOUNTER — Ambulatory Visit (INDEPENDENT_AMBULATORY_CARE_PROVIDER_SITE_OTHER): Payer: BC Managed Care – PPO | Admitting: Family Medicine

## 2022-08-08 ENCOUNTER — Encounter: Payer: Self-pay | Admitting: Family Medicine

## 2022-08-08 VITALS — BP 138/90 | HR 97 | Temp 99.1°F | Ht 63.5 in | Wt 121.6 lb

## 2022-08-08 DIAGNOSIS — L304 Erythema intertrigo: Secondary | ICD-10-CM

## 2022-08-08 DIAGNOSIS — L290 Pruritus ani: Secondary | ICD-10-CM

## 2022-08-08 MED ORDER — NYSTATIN 100000 UNIT/GM EX POWD: 1.0000 | Freq: Three times a day (TID) | CUTANEOUS | 0 refills | Status: AC

## 2022-08-08 MED ORDER — KETOCONAZOLE 2 % EX CREA: 1.0000 | TOPICAL_CREAM | Freq: Every day | CUTANEOUS | 0 refills | Status: DC

## 2022-08-08 MED ORDER — NYSTATIN 100000 UNIT/GM EX OINT: 1.0000 | TOPICAL_OINTMENT | Freq: Two times a day (BID) | CUTANEOUS | 0 refills | Status: DC

## 2022-08-08 NOTE — Progress Notes (Signed)
Established Patient Office Visit   Subjective  Patient ID: Julia Elliott, female    DOB: 14-Feb-1981  Age: 42 y.o. MRN: [id number]  Chief Complaint  Patient presents with   Vaginal Itching    Pt reports vaginal itching with white/cream discharge. Awk and half ago. Took 2 diflucan. Didn't do anything.    Anal Itching    Patient is a 42 year old female seen for acute concern.  Patient endorses vaginal and anal pruritus x 1.5 weeks while on a trip to [place].  Patient noticed minimal white discharge.  Took Diflucan x 2 without improvement in symptoms.  Patient denies changes in soaps, lotions, detergents.  Had similar symptoms while on a trip to [place].  On continuous OCPs for endometriosis. Notes scant brownish  spotting.  Scheduled to leave for [place] on Saturday.    Patient Active Problem List   Diagnosis Date Noted   Right tubo-ovarian mass 11/07/2019   Endometrial polyp 11/07/2019   Elevated CA-125 11/07/2019   Past Medical History:  Diagnosis Date   Chicken pox    Social History   Tobacco Use   Smoking status: Never   Smokeless tobacco: Never  Vaping Use   Vaping Use: Never used  Substance Use Topics   Alcohol use: Yes    Comment: once-twice a week   Drug use: Never   Family History  Problem Relation Age of Onset   Hypertension Father    Hyperlipidemia Father    No Known Allergies    ROS Negative unless stated above    Objective:     BP (!) 138/90 (BP Location: Right Arm, Patient Position: Sitting, Cuff Size: Normal)   Pulse 97   Temp 99.1 F (37.3 C) (Oral)   Ht 5' 3.5" (1.613 m)   Wt 121 lb 9.6 oz (55.2 kg)   SpO2 99%   BMI 21.20 kg/m    Physical Exam Constitutional:      Appearance: Normal appearance.  HENT:     Head: Normocephalic and atraumatic.     Mouth/Throat:     Mouth: Mucous membranes are moist.  Eyes:     Extraocular Movements: Extraocular movements intact.     Conjunctiva/sclera: Conjunctivae normal.   Cardiovascular:     Rate and Rhythm: Normal rate and regular rhythm.     Heart sounds: Normal heart sounds.  Pulmonary:     Effort: Pulmonary effort is normal.     Breath sounds: Normal breath sounds.  Genitourinary:    Comments: Normal-appearing skin of the perineum and anus.  Sweating/increased moisture in gluteal cleft.  Labia majora with dry,  lichenified appearance.  Faint erythema and satellite lesions noted on bilateral labia minora.  No discharge noted. Neurological:     Mental Status: She is alert.      No results found for any visits on 08/08/22.    Assessment & Plan:  Intertrigo -     Nystatin; Apply 1 Application topically 3 (three) times daily.  Dispense: 15 g; Refill: 0 -     Ketoconazole; Apply 1 Application topically daily.  Dispense: 15 g; Refill: 0  Anal pruritus -     Nystatin; Apply 1 Application topically 3 (three) times daily.  Dispense: 15 g; Refill: 0  New problem.  Discussed symptoms likely 2/2 intertrigo due to increased moisture being trapped against skin.  Start ketoconazole cream for antifungal, antibacterial, and anti-inflammatory effects.  Given nystatin powder due to frequent travel.  Moisture wicking fabrics and other prevention discussed.  Return if symptoms worsen or fail to improve.   [name], MD

## 2022-09-05 ENCOUNTER — Telehealth: Payer: Self-pay | Admitting: Family Medicine

## 2022-09-05 NOTE — Telephone Encounter (Signed)
ERROR - Please disregard - Pt decided to make an appt instead

## 2022-09-07 ENCOUNTER — Ambulatory Visit (INDEPENDENT_AMBULATORY_CARE_PROVIDER_SITE_OTHER): Payer: BC Managed Care – PPO | Admitting: Family Medicine

## 2022-09-07 ENCOUNTER — Encounter: Payer: Self-pay | Admitting: Family Medicine

## 2022-09-07 VITALS — BP 108/82 | HR 110 | Temp 97.2°F | Wt 123.8 lb

## 2022-09-07 DIAGNOSIS — H6690 Otitis media, unspecified, unspecified ear: Secondary | ICD-10-CM | POA: Diagnosis not present

## 2022-09-07 DIAGNOSIS — J029 Acute pharyngitis, unspecified: Secondary | ICD-10-CM | POA: Diagnosis not present

## 2022-09-07 DIAGNOSIS — J302 Other seasonal allergic rhinitis: Secondary | ICD-10-CM | POA: Diagnosis not present

## 2022-09-07 DIAGNOSIS — H6992 Unspecified Eustachian tube disorder, left ear: Secondary | ICD-10-CM

## 2022-09-07 LAB — POCT RAPID STREP A (OFFICE): Rapid Strep A Screen: NEGATIVE

## 2022-09-07 MED ORDER — MONTELUKAST SODIUM 10 MG PO TABS: 10.0000 mg | ORAL_TABLET | Freq: Every day | ORAL | 3 refills | Status: AC

## 2022-09-07 MED ORDER — CEPHALEXIN 500 MG PO CAPS: 500.0000 mg | ORAL_CAPSULE | Freq: Two times a day (BID) | ORAL | 0 refills | Status: AC

## 2022-09-07 NOTE — Progress Notes (Signed)
Established Patient Office Visit   Subjective  Patient ID: Julia Elliott, female    DOB: [birth year]  Age: 43 y.o. MRN: [id number]  Chief Complaint  Patient presents with   Sinus Problem    Congestion, feels like a cold for last 2 weeks.  Nasal drip sore throat, HA, congestion on left side and behind left eye and ear feels like a fullness with pressure. Advil for HA and throat, flonase and claritin daily and also tried salt water rinse for throat     Patient is a 41 year old female who presents for follow-up on ongoing concern.  Patient with left-sided sinus congestion, rhinorrhea, left ear pressure, sneezing and sore throat x 2 weeks.  Sore throat improving.  Patient notes sinus symptoms have been ongoing and never fully improved in the last few months.  Patient denies cough, fever, chills, nausea, vomiting.  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  Patient seen by ENT who felt cyst was not contributing to symptoms.  Patient using nasal sprays and OTC antihistamines without improvement in symptoms.  Sinus Problem   Claritin daily x month Patient Active Problem List   Diagnosis Date Noted   Right tubo-ovarian mass 11/07/2019   Endometrial polyp 11/07/2019   Elevated CA-125 11/07/2019   Past Surgical History:  Procedure Laterality Date   CYST EXCISION N/A    neck   HYSTEROSCOPY WITH D & C N/A 01/14/2020   Procedure: DILATATION AND CURETTAGE /HYSTEROSCOPY;  Surgeon: Adolphus Birchwood, MD;  Location: [facility];  Service: Gynecology;  Laterality: N/A;   ROBOTIC ASSISTED SALPINGO OOPHERECTOMY N/A 01/14/2020   Procedure: XI ROBOTIC ASSISTED RIGHT SALPINGO-OOPHORECTOMY, LEFT OVARIAN CYSTECTOMY, LYSIS OF ADHESIONS;  Surgeon: Adolphus Birchwood, MD;  Location: [facility];  Service: Gynecology;  Laterality: N/A;   WISDOM TOOTH EXTRACTION     Social History   Tobacco Use   Smoking status: Never   Smokeless tobacco: Never  Vaping Use    Vaping Use: Never used  Substance Use Topics   Alcohol use: Yes    Comment: once-twice a week   Drug use: Never   Family History  Problem Relation Age of Onset   Hypertension Father    Hyperlipidemia Father    No Known Allergies    ROS Negative unless stated above    Objective:     BP 108/82 (BP Location: Right Arm, Patient Position: Sitting, Cuff Size: Normal)   Pulse (!) 110   Temp (!) 97.2 F (36.2 C) (Temporal)   Wt 123 lb 12.8 oz (56.2 kg)   SpO2 97%   BMI 21.59 kg/m    Physical Exam Constitutional:      General: She is not in acute distress.    Appearance: Normal appearance.  HENT:     Head: Normocephalic and atraumatic.     Ears:     Comments: L TM full with faint erythema and scant suppurative fluid.  R TM full.    Nose: Nose normal.     Mouth/Throat:     Mouth: Mucous membranes are moist.     Pharynx: No posterior oropharyngeal erythema.  Cardiovascular:     Rate and Rhythm: Normal rate and regular rhythm.     Heart sounds: Normal heart sounds. No murmur heard.    No gallop.  Pulmonary:     Effort: Pulmonary effort is normal. No respiratory distress.     Breath sounds: Normal breath sounds. No wheezing, rhonchi or  rales.  Skin:    General: Skin is warm and dry.  Neurological:     Mental Status: She is alert and oriented to person, place, and time.      Results for orders placed or performed in visit on 09/07/22  POC Rapid Strep A  Result Value Ref Range   Rapid Strep A Screen Negative Negative      Assessment & Plan:  Acute otitis media, unspecified otitis media type -     Cephalexin; Take 1 capsule (500 mg total) by mouth 2 (two) times daily for 7 days.  Dispense: 14 capsule; Refill: 0  Sore throat -     POCT rapid strep A  Eustachian tube dysfunction, left -     Montelukast Sodium; Take 1 tablet (10 mg total) by mouth at bedtime.  Dispense: 30 tablet; Refill: 3 -     Ambulatory referral to Allergy  Seasonal allergies -      Montelukast Sodium; Take 1 tablet (10 mg total) by mouth at bedtime.  Dispense: 30 tablet; Refill: 3 -     Ambulatory referral to Allergy  Acute on chronic issue.  Strep testing negative.  Start abx for AOM.  Start singulair.  Okay to continue nasal spray, Flonase.  CT sinuses 06/28/2022 with 14 mm mucinous retention cyst in left nasal septum deviation.  Follow-up with ENT.  If needed can place referral for second opinion.  Referral to allergy for allergy testing.   Return if symptoms worsen or fail to improve.   [name], MD

## 2022-09-07 NOTE — Patient Instructions (Addendum)
Happy early birthday!!!  Your strep testing was negative.  I think all the ENT providers in town are at the same place on [address].  Drs. Geanie Logan, Christia Reading, or Su Suszanne Conners may be other options that you could try.

## 2022-10-05 DIAGNOSIS — Z1231 Encounter for screening mammogram for malignant neoplasm of breast: Secondary | ICD-10-CM | POA: Diagnosis not present

## 2022-10-05 DIAGNOSIS — Z124 Encounter for screening for malignant neoplasm of cervix: Secondary | ICD-10-CM | POA: Diagnosis not present

## 2022-10-05 DIAGNOSIS — Z01419 Encounter for gynecological examination (general) (routine) without abnormal findings: Secondary | ICD-10-CM | POA: Diagnosis not present

## 2022-10-05 LAB — HM MAMMOGRAPHY

## 2022-10-07 DIAGNOSIS — F321 Major depressive disorder, single episode, moderate: Secondary | ICD-10-CM | POA: Diagnosis not present

## 2022-10-25 NOTE — Progress Notes (Unsigned)
New Patient Note  RE: Julia Elliott MRN: [id number] DOB: 10/01/1980 Date of Office Visit: 10/26/2022  Consult requested by: [name], MD Primary care provider: [name], MD  Chief Complaint: Sinus Problem and Headache  History of Present Illness: I had the pleasure of seeing Julia Elliott for initial evaluation at the [facility] on 10/26/2022. She is a 42 y.o. female, who is referred here by [name], MD for the evaluation of allergic rhinitis.  She reports symptoms of nasal congestion, rhinorrhea, ear fullness, PND, sneezing. Symptoms have been going on for 3 years. The symptoms are present mainly in the spring. Anosmia: no. Headache: sometimes. She has used Flonase and Claritin with some improvement in symptoms. Sinus infections: yes 4-5 courses this year. Previous work up includes: none. Previous ENT evaluation: yes. Previous sinus imaging: yes. History of nasal polyps: no. History of reflux: denies.  08/02/2022 ENT visit: "1) sinusitis  Fluticasone nasal spray daily OTC antihistamine daily May use saline as indicated "  06/28/2022 CT sinus: "IMPRESSION: 1. The right frontal sinus is somewhat hypoplastic as compared to the left. 2. 14 mm mucous retention cyst, mild mucosal thickening and possible small fluid level within the left maxillary sinus. 3. The paranasal sinuses are otherwise normally aerated. 4. Patent sinus drainage pathways. 5. Leftward deviation of the nasal septum. 6. Mild mucosal thickening, and minimal secretions, within the bilateral nasal passages."  Assessment and Plan: Julia Elliott is a 42 y.o. female with: Seasonal allergic rhinitis due to pollen Allergic rhinitis due to dust mite Allergic rhinitis due to mold Rhinitis symptoms mainly in the spring but has frequent sinus infections requiring 5-6 courses of antibiotics per year. ENT evaluation showed a retention cyst. Tried Claritin and Flonase with some  benefit. Travels a lot.  Today's skin testing:  Positive to grass, weed. Borderline to mold mix #4 and dust mites.  Start environmental control measures as below. Use over the counter antihistamines such as Zyrtec (cetirizine), Claritin (loratadine), Allegra (fexofenadine), or Xyzal (levocetirizine) daily as needed. May take twice a day during allergy flares. May switch antihistamines every few months. Start Singulair (montelukast) 10mg  daily at night. Cautioned that in some children/adults can experience behavioral changes including hyperactivity, agitation, depression, sleep disturbances and suicidal ideations. These side effects are rare, but if you notice them you should notify me and discontinue Singulair (montelukast). Start Ryaltris (olopatadine + mometasone nasal spray combination) 1-2 sprays per nostril twice a day. Sample given. This replaces your other nasal sprays. If this works well for you, then have [facility] ship the medication to your home - prescription already sent in.  Nasal saline spray (i.e., Simply Saline) or nasal saline lavage (i.e., NeilMed) is recommended as needed and prior to medicated nasal sprays. Consider allergy injections for long term control if above medications do not help the symptoms - handout given.   Recurrent sinusitis Keep track of infections and antibiotics use. If persistent will get bloodwork next to look at immune system.  Environmental allergies can increase risks for sinusitis.   Return in about 3 months (around 01/26/2023).  Meds ordered this encounter  Medications   Olopatadine-Mometasone (RYALTRIS) 665-25 MCG/ACT SUSP    Sig: Place 1-2 sprays into the nose in the morning and at bedtime.    Dispense:  29 g    Refill:  5    [phone]   Lab Orders  No laboratory test(s) ordered today    Other allergy screening: Asthma: no Food  allergy: no Medication allergy: no Hymenoptera allergy: no Urticaria: no Eczema:no History of recurrent  infections suggestive of immunodeficency: no  Diagnostics: Skin Testing: Environmental allergy panel. Positive to grass, weed. Borderline to mold mix #4 and dust mites.  Results discussed with patient/family.  Airborne Adult Perc - 10/26/22 1500     Time Antigen Placed 1500    Allergen Manufacturer Greer    Location Back    Number of Test 55    1. Control-Buffer 50% Glycerol Negative    2. Control-Histamine 2+    3. Bahia 2+    4. [place] 2+    5. Johnson 2+    6. Kentucky Blue 3+    7. Meadow Fescue 3+    8. Perennial Rye 3+    9. Timothy 2+    10. Ragweed Mix Negative    11. Cocklebur Negative    12. Plantain,  English 2+    13. Baccharis Negative    14. Dog Fennel Negative    15. Russian Thistle Negative    16. Lamb's Quarters 2+    17. Sheep Sorrell Negative    18. Rough Pigweed 2+    19. Marsh Elder, Rough Negative    20. Mugwort, Common Negative    21. Box, Elder Negative    22. Cedar, red Negative    23. Sweet Gum Negative    24. Pecan Pollen Negative    25. Pine Mix Negative    26. Walnut, Black Pollen Negative    27. Red Mulberry Negative    28. Ash Mix Negative    29. Birch Mix Negative    30. Beech American Negative    31. Cottonwood, Guinea-Bissau Negative    32. Hickory, White Negative    33. Maple Mix Negative    34. Oak, Guinea-[place] Mix Negative    35. Sycamore Eastern Negative    36. Alternaria Alternata Negative    37. Cladosporium Herbarum Negative    38. Aspergillus Mix Negative    39. Penicillium Mix Negative    40. Bipolaris Sorokiniana (Helminthosporium) Negative    41. Drechslera Spicifera (Curvularia) Negative    42. Mucor Plumbeus Negative    43. Fusarium Moniliforme Negative    44. Aureobasidium Pullulans (pullulara) Negative    45. Rhizopus Oryzae Negative    46. Botrytis Cinera Negative    47. Epicoccum Nigrum Negative    48. Phoma Betae Negative    49. Dust Mite Mix Negative    50. Cat Hair 10,000 BAU/ml Negative    51.  Dog Epithelia  Negative    52. Mixed Feathers Negative    53. Horse Epithelia Negative    54. Cockroach, German Negative    55. Tobacco Leaf Negative             Intradermal - 10/26/22 1600     Time Antigen Placed 1555    Allergen Manufacturer Waynette Buttery    Location Arm    Number of Test 11    Control Negative    Ragweed Mix Negative    Tree Mix Negative    Mold 1 Negative    Mold 2 Negative    Mold 3 Negative    Mold 4 --   +/-   Mite Mix --   +/-   Cat Negative    Dog Negative    Cockroach Negative             Past Medical History: Patient Active Problem List   Diagnosis Date Noted  Right tubo-ovarian mass 11/07/2019   Endometrial polyp 11/07/2019   Elevated CA-125 11/07/2019   Past Medical History:  Diagnosis Date   Chicken pox    Past Surgical History: Past Surgical History:  Procedure Laterality Date   CYST EXCISION N/A    neck   HYSTEROSCOPY WITH D & C N/A 01/14/2020   Procedure: DILATATION AND CURETTAGE /HYSTEROSCOPY;  Surgeon: Adolphus Birchwood, MD;  Location: [facility];  Service: Gynecology;  Laterality: N/A;   ROBOTIC ASSISTED SALPINGO OOPHERECTOMY N/A 01/14/2020   Procedure: XI ROBOTIC ASSISTED RIGHT SALPINGO-OOPHORECTOMY, LEFT OVARIAN CYSTECTOMY, LYSIS OF ADHESIONS;  Surgeon: Adolphus Birchwood, MD;  Location: [facility];  Service: Gynecology;  Laterality: N/A;   WISDOM TOOTH EXTRACTION     Medication List:  Current Outpatient Medications  Medication Sig Dispense Refill   fluticasone (FLONASE) 50 MCG/ACT nasal spray Place into both nostrils daily.     ibuprofen (ADVIL) 200 MG tablet Take 400 mg by mouth every 6 (six) hours as needed for fever, headache or mild pain.     loratadine (CLARITIN) 10 MG tablet Take 10 mg by mouth daily.     Multiple Vitamin (MULTIVITAMIN ADULT PO) Take 1 tablet by mouth daily.      Norethindrone Acetate-Ethinyl Estradiol (LOESTRIN) 1.5-30 MG-MCG tablet Do not take the inactive pills at the end of the pack, Continue to new pack and take active pills  continuously 28 tablet 11   nystatin (MYCOSTATIN/NYSTOP) powder Apply 1 Application topically 3 (three) times daily. 15 g 0   Olopatadine-Mometasone (RYALTRIS) 665-25 MCG/ACT SUSP Place 1-2 sprays into the nose in the morning and at bedtime. 29 g 5   montelukast (SINGULAIR) 10 MG tablet Take 1 tablet (10 mg total) by mouth at bedtime. (Patient not taking: Reported on 10/26/2022) 30 tablet 3   No current facility-administered medications for this visit.   Allergies: No Known Allergies Social History: Social History   Socioeconomic History   Marital status: Married    Spouse name: Not on file   Number of children: Not on file   Years of education: Not on file   Highest education level: Master's degree (e.g., MA, MS, MEng, MEd, MSW, MBA)  Occupational History   Not on file  Tobacco Use   Smoking status: Never   Smokeless tobacco: Never  Vaping Use   Vaping status: Never Used  Substance and Sexual Activity   Alcohol use: Yes    Comment: once-twice a week   Drug use: Never   Sexual activity: Yes  Other Topics Concern   Not on file  Social History Narrative   Not on file   Social Determinants of Health   Financial Resource Strain: Low Risk  (10/05/2021)   Overall Financial Resource Strain (CARDIA)    Difficulty of Paying Living Expenses: Not hard at all  Food Insecurity: Low Risk  (08/02/2022)   [facility]   Food vital sign    Within the past 12 months, you worried that your food would run out before you got money to buy more: Never true    Within the past 12 months, the food you bought just didn't last and you didn't have money to get more. : Never true  Transportation Needs: Not on file (08/02/2022)  Physical Activity: Insufficiently Active (10/05/2021)   Exercise Vital Sign    Days of Exercise per Week: 4 days    Minutes of Exercise per Session: 30 min  Stress: Stress Concern Present (10/05/2021)   [employer]  of Occupational Health -  Occupational Stress Questionnaire    Feeling of Stress : To some extent  Social Connections: Moderately Isolated (10/05/2021)   Social Connection and Isolation Panel [NHANES]    Frequency of Communication with Friends and Family: More than three times a week    Frequency of Social Gatherings with Friends and Family: Twice a week    Attends Religious Services: Never    Database administrator or Organizations: No    Attends Engineer, structural: Not on file    Marital Status: Married   Lives in a house. Smoking: denies Occupation: self employed  Environmental HistorySurveyor, minerals in the house: no Engineer, civil (consulting) in the family room: no Carpet in the bedroom: yes Heating: electric Cooling: central Pet: no  Family History: Family History  Problem Relation Age of Onset   Hypertension Father    Hyperlipidemia Father    Problem                               Relation Asthma                                   no Eczema                                no Food allergy                          no Allergic rhino conjunctivitis     brother on AIT  Review of Systems  Constitutional:  Negative for appetite change, chills, fever and unexpected weight change.  HENT:  Positive for congestion, postnasal drip, rhinorrhea and sinus pressure.   Eyes:  Negative for itching.  Respiratory:  Negative for cough, chest tightness, shortness of breath and wheezing.   Cardiovascular:  Negative for chest pain.  Gastrointestinal:  Negative for abdominal pain.  Genitourinary:  Negative for difficulty urinating.  Skin:  Negative for rash.  Allergic/Immunologic: Positive for environmental allergies.  Neurological:  Negative for headaches.    Objective: BP 110/70   Pulse 88   Temp 98.3 F (36.8 C) (Temporal)   Resp 14   Ht 5\' 3"  (1.6 m)   Wt 123 lb 8 oz (56 kg)   SpO2 97%   BMI 21.88 kg/m  Body mass index is 21.88 kg/m. Physical Exam Vitals and nursing note reviewed.  Constitutional:       Appearance: Normal appearance. She is well-developed.  HENT:     Head: Normocephalic and atraumatic.     Right Ear: Tympanic membrane and external ear normal.     Left Ear: Tympanic membrane and external ear normal.     Nose: Nose normal.     Mouth/Throat:     Mouth: Mucous membranes are moist.     Pharynx: Oropharynx is clear.  Eyes:     Conjunctiva/sclera: Conjunctivae normal.  Cardiovascular:     Rate and Rhythm: Normal rate and regular rhythm.     Heart sounds: Normal heart sounds. No murmur heard.    No friction rub. No gallop.  Pulmonary:     Effort: Pulmonary effort is normal.     Breath sounds: Normal breath sounds. No wheezing, rhonchi or rales.  Musculoskeletal:     Cervical back: Neck supple.  Skin:    General: Skin is warm.     Findings: No rash.  Neurological:     Mental Status: She is alert and oriented to person, place, and time.  Psychiatric:        Behavior: Behavior normal.    The plan was reviewed with the patient/family, and all questions/concerned were addressed.  It was my pleasure to see Julia Elliott today and participate in her care. Please feel free to contact me with any questions or concerns.  Sincerely,  Wyline Mood, DO Allergy & Immunology  [facility] office: [phone] [facility] office: [phone]

## 2022-10-26 ENCOUNTER — Encounter: Payer: Self-pay | Admitting: Allergy

## 2022-10-26 ENCOUNTER — Other Ambulatory Visit: Payer: Self-pay

## 2022-10-26 ENCOUNTER — Ambulatory Visit (INDEPENDENT_AMBULATORY_CARE_PROVIDER_SITE_OTHER): Payer: BC Managed Care – PPO | Admitting: Allergy

## 2022-10-26 VITALS — BP 110/70 | HR 88 | Temp 98.3°F | Resp 14 | Ht 63.0 in | Wt 123.5 lb

## 2022-10-26 DIAGNOSIS — J3089 Other allergic rhinitis: Secondary | ICD-10-CM

## 2022-10-26 DIAGNOSIS — F321 Major depressive disorder, single episode, moderate: Secondary | ICD-10-CM | POA: Diagnosis not present

## 2022-10-26 DIAGNOSIS — J301 Allergic rhinitis due to pollen: Secondary | ICD-10-CM | POA: Diagnosis not present

## 2022-10-26 DIAGNOSIS — J329 Chronic sinusitis, unspecified: Secondary | ICD-10-CM | POA: Diagnosis not present

## 2022-10-26 MED ORDER — RYALTRIS 665-25 MCG/ACT NA SUSP: 1.0000 | Freq: Two times a day (BID) | NASAL | 5 refills | Status: DC

## 2022-10-26 NOTE — Patient Instructions (Addendum)
Today's skin testing:  Positive to grass, weed. Borderline to mold mix #4 and dust mites.  Results given.  Environmental allergies Start environmental control measures as below. Use over the counter antihistamines such as Zyrtec (cetirizine), Claritin (loratadine), Allegra (fexofenadine), or Xyzal (levocetirizine) daily as needed. May take twice a day during allergy flares. May switch antihistamines every few months. Start Singulair (montelukast) 10mg  daily at night. Cautioned that in some children/adults can experience behavioral changes including hyperactivity, agitation, depression, sleep disturbances and suicidal ideations. These side effects are rare, but if you notice them you should notify me and discontinue Singulair (montelukast). Start Ryaltris (olopatadine + mometasone nasal spray combination) 1-2 sprays per nostril twice a day. Sample given. This replaces your other nasal sprays. If this works well for you, then have [facility] ship the medication to your home - prescription already sent in.  Nasal saline spray (i.e., Simply Saline) or nasal saline lavage (i.e., NeilMed) is recommended as needed and prior to medicated nasal sprays.  Consider allergy injections for long term control if above medications do not help the symptoms - handout given.   Infections Keep track of infections and antibiotics use. If persistent will get bloodwork next to look at immune system.   Return in about 3 months (around 01/26/2023). Or sooner if needed.   Reducing Pollen Exposure Pollen seasons: trees (spring), grass (summer) and ragweed/weeds (fall). Keep windows closed in your home and car to lower pollen exposure.  Install air conditioning in the bedroom and throughout the house if possible.  Avoid going out in dry windy days - especially early morning. Pollen counts are highest between 5 - 10 AM and on dry, hot and windy days.  Save outside activities for late afternoon or after a heavy rain, when  pollen levels are lower.  Avoid mowing of grass if you have grass pollen allergy. Be aware that pollen can also be transported indoors on people and pets.  Dry your clothes in an automatic dryer rather than hanging them outside where they might collect pollen.  Rinse hair and eyes before bedtime.  Control of House Dust Mite Allergen Dust mite allergens are a common trigger of allergy and asthma symptoms. While they can be found throughout the house, these microscopic creatures thrive in warm, humid environments such as bedding, upholstered furniture and carpeting. Because so much time is spent in the bedroom, it is essential to reduce mite levels there.  Encase pillows, mattresses, and box springs in special allergen-proof fabric covers or airtight, zippered plastic covers.  Bedding should be washed weekly in hot water (130 F) and dried in a hot dryer. Allergen-proof covers are available for comforters and pillows that can't be regularly washed.  Wash the allergy-proof covers every few months. Minimize clutter in the bedroom. Keep pets out of the bedroom.  Keep humidity less than 50% by using a dehumidifier or air conditioning. You can buy a humidity measuring device called a hygrometer to monitor this.  If possible, replace carpets with hardwood, linoleum, or washable area rugs. If that's not possible, vacuum frequently with a vacuum that has a HEPA filter. Remove all upholstered furniture and non-washable window drapes from the bedroom. Remove all non-washable stuffed toys from the bedroom.  Wash stuffed toys weekly.  Mold Control Mold and fungi can grow on a variety of surfaces provided certain temperature and moisture conditions exist.  Outdoor molds grow on plants, decaying vegetation and soil. The major outdoor mold, Alternaria and Cladosporium, are found in very high  numbers during hot and dry conditions. Generally, a late summer - fall peak is seen for common outdoor fungal spores. Rain  will temporarily lower outdoor mold spore count, but counts rise rapidly when the rainy period ends. The most important indoor molds are Aspergillus and Penicillium. Dark, humid and poorly ventilated basements are ideal sites for mold growth. The next most common sites of mold growth are the bathroom and the kitchen. Outdoor (Seasonal) Mold Control Use air conditioning and keep windows closed. Avoid exposure to decaying vegetation. Avoid leaf raking. Avoid grain handling. Consider wearing a face mask if working in moldy areas.  Indoor (Perennial) Mold Control  Maintain humidity below 50%. Get rid of mold growth on hard surfaces with water, detergent and, if necessary, 5% bleach (do not mix with other cleaners). Then dry the area completely. If mold covers an area more than 10 square feet, consider hiring an indoor environmental professional. For clothing, washing with soap and water is best. If moldy items cannot be cleaned and dried, throw them away. Remove sources e.g. contaminated carpets. Repair and seal leaking roofs or pipes. Using dehumidifiers in damp basements may be helpful, but empty the water and clean units regularly to prevent mildew from forming. All rooms, especially basements, bathrooms and kitchens, require ventilation and cleaning to deter mold and mildew growth. Avoid carpeting on concrete or damp floors, and storing items in damp areas.

## 2022-11-01 DIAGNOSIS — J309 Allergic rhinitis, unspecified: Secondary | ICD-10-CM | POA: Diagnosis not present

## 2022-11-01 DIAGNOSIS — J0191 Acute recurrent sinusitis, unspecified: Secondary | ICD-10-CM | POA: Diagnosis not present

## 2022-11-01 DIAGNOSIS — J029 Acute pharyngitis, unspecified: Secondary | ICD-10-CM | POA: Diagnosis not present

## 2022-11-15 DIAGNOSIS — F321 Major depressive disorder, single episode, moderate: Secondary | ICD-10-CM | POA: Diagnosis not present

## 2022-11-15 DIAGNOSIS — J320 Chronic maxillary sinusitis: Secondary | ICD-10-CM | POA: Diagnosis not present

## 2022-11-28 ENCOUNTER — Ambulatory Visit (INDEPENDENT_AMBULATORY_CARE_PROVIDER_SITE_OTHER): Payer: BC Managed Care – PPO | Admitting: Family Medicine

## 2022-11-28 ENCOUNTER — Encounter: Payer: Self-pay | Admitting: Family Medicine

## 2022-11-28 VITALS — BP 110/70 | HR 92 | Temp 98.7°F | Ht 63.0 in | Wt 124.2 lb

## 2022-11-28 DIAGNOSIS — M791 Myalgia, unspecified site: Secondary | ICD-10-CM | POA: Diagnosis not present

## 2022-11-28 DIAGNOSIS — R6884 Jaw pain: Secondary | ICD-10-CM

## 2022-11-28 DIAGNOSIS — J302 Other seasonal allergic rhinitis: Secondary | ICD-10-CM | POA: Diagnosis not present

## 2022-11-28 MED ORDER — CYCLOBENZAPRINE HCL 5 MG PO TABS: 5.0000 mg | ORAL_TABLET | Freq: Every evening | ORAL | 0 refills | Status: AC | PRN

## 2022-11-28 NOTE — Progress Notes (Signed)
Established Patient Office Visit   Subjective  Patient ID: Julia Elliott, female    DOB: 03-17-1980  Age: 42 y.o. MRN: [id number]  Chief Complaint  Patient presents with   Jaw Pain    Patient came in today for jaw and neck pain, started a month ago, patient seen ENT and they said it could possibly be TMJ, patient is having pressure in the ear and behind the eye, dull ache, with headaches at night    Pt is a 42 yo female seen for f/u.  Since last OFV pt seen by allergist.  Allergy testing positive for some grasses, pollen, ragweed.  Pt advised to consider switching allergy med from Claritin to allegra.  Ryaltris nasal spray not really making a difference.  Also seen by ENT, Dr. Jenne Pane.  Pt thought to have chronic sinusitis and cyst in sinuses on CT non-contributory.  Pt notes having L sided facial pain/pressure x 1 month.  Sensation is a dull, achiness in L jaw, L ear, L side of neck, and postauricular area.  Also having more HAs at night.  Pt holds tension in shoulders and neck.  Gets regular massages.  Started counseling 1 mo ago.  Journaling.  Had a root canal in [place] a few yrs ago and asked if grinds teeth.  Current dentist does not feel pt is grinding teeth.   Past Medical History:  Diagnosis Date   Chicken pox    Past Surgical History:  Procedure Laterality Date   CYST EXCISION N/A    neck   HYSTEROSCOPY WITH D & C N/A 01/14/2020   Procedure: DILATATION AND CURETTAGE /HYSTEROSCOPY;  Surgeon: Adolphus Birchwood, MD;  Location: [facility];  Service: Gynecology;  Laterality: N/A;   ROBOTIC ASSISTED SALPINGO OOPHERECTOMY N/A 01/14/2020   Procedure: XI ROBOTIC ASSISTED RIGHT SALPINGO-OOPHORECTOMY, LEFT OVARIAN CYSTECTOMY, LYSIS OF ADHESIONS;  Surgeon: Adolphus Birchwood, MD;  Location: [facility];  Service: Gynecology;  Laterality: N/A;   WISDOM TOOTH EXTRACTION     Social History   Tobacco Use   Smoking status: Never   Smokeless tobacco: Never  Vaping Use   Vaping status: Never Used  Substance  Use Topics   Alcohol use: Yes    Comment: once-twice a week   Drug use: Never   Family History  Problem Relation Age of Onset   Hypertension Father    Hyperlipidemia Father    No Known Allergies    ROS Negative unless stated above    Objective:     BP 110/70 (BP Location: Left Arm, Patient Position: Sitting, Cuff Size: Normal)   Pulse 92   Temp 98.7 F (37.1 C) (Oral)   Ht 5\' 3"  (1.6 m)   Wt 124 lb 3.2 oz (56.3 kg)   LMP  (LMP Unknown) Comment: not having periods  SpO2 98%   BMI 22.00 kg/m    Physical Exam Constitutional:      General: She is not in acute distress.    Appearance: Normal appearance.  HENT:     Head: Normocephalic and atraumatic.     Jaw: There is normal jaw occlusion.     Nose: Nose normal.     Right Sinus: No maxillary sinus tenderness or frontal sinus tenderness.     Left Sinus: No maxillary sinus tenderness or frontal sinus tenderness.     Mouth/Throat:     Mouth: Mucous membranes are moist.  Eyes:     Extraocular Movements: Extraocular movements intact.     Conjunctiva/sclera: Conjunctivae normal.  Pupils: Pupils are equal, round, and reactive to light.  Cardiovascular:     Rate and Rhythm: Normal rate and regular rhythm.  Pulmonary:     Effort: Pulmonary effort is normal.  Musculoskeletal:     Cervical back: No tenderness.  Lymphadenopathy:     Cervical: No cervical adenopathy.  Skin:    General: Skin is warm and dry.  Neurological:     Mental Status: She is alert and oriented to person, place, and time.     No results found for any visits on 11/28/22.    Assessment & Plan:  Jaw pain -     Cyclobenzaprine HCl; Take 1 tablet (5 mg total) by mouth at bedtime as needed.  Dispense: 30 tablet; Refill: 0  Seasonal allergies  Muscle tension pain -     Cyclobenzaprine HCl; Take 1 tablet (5 mg total) by mouth at bedtime as needed.  Dispense: 30 tablet; Refill: 0  Concern for TMJ vs bruxism causing increased muscle tension in  face and neck. Trial of Flexeril at bedtime prn.  Also consider mouth guard at night.  For continued symptoms pt will contact clinic for a referral to PT.  Continue singulair and OTC allergy meds.  Return if symptoms worsen or fail to improve.   [name], MD

## 2022-12-04 ENCOUNTER — Encounter: Payer: Self-pay | Admitting: Family Medicine

## 2023-01-16 ENCOUNTER — Ambulatory Visit (INDEPENDENT_AMBULATORY_CARE_PROVIDER_SITE_OTHER): Payer: BC Managed Care – PPO | Admitting: Family Medicine

## 2023-01-16 VITALS — BP 120/74 | HR 112 | Temp 99.4°F | Ht 63.0 in | Wt 124.0 lb

## 2023-01-16 DIAGNOSIS — J029 Acute pharyngitis, unspecified: Secondary | ICD-10-CM | POA: Diagnosis not present

## 2023-01-16 DIAGNOSIS — J329 Chronic sinusitis, unspecified: Secondary | ICD-10-CM

## 2023-01-16 DIAGNOSIS — H6502 Acute serous otitis media, left ear: Secondary | ICD-10-CM

## 2023-01-16 DIAGNOSIS — R0981 Nasal congestion: Secondary | ICD-10-CM

## 2023-01-16 DIAGNOSIS — R000 Tachycardia, unspecified: Secondary | ICD-10-CM

## 2023-01-16 LAB — POC COVID19 BINAXNOW: SARS Coronavirus 2 Ag: NEGATIVE

## 2023-01-16 LAB — POCT INFLUENZA A/B
Influenza A, POC: NEGATIVE
Influenza B, POC: NEGATIVE

## 2023-01-16 LAB — POCT RAPID STREP A (OFFICE): Rapid Strep A Screen: NEGATIVE

## 2023-01-16 MED ORDER — CEFDINIR 300 MG PO CAPS
300.0000 mg | ORAL_CAPSULE | Freq: Two times a day (BID) | ORAL | 0 refills | Status: AC
Start: 2023-01-16 — End: 2023-01-26

## 2023-01-16 NOTE — Progress Notes (Signed)
Established Patient Office Visit   Subjective  Patient ID: Julia Elliott, female    DOB: May 06, 1980  Age: 42 y.o. MRN: [id number]  Chief Complaint  Patient presents with   Cough    Cough, cold congestion, sinus pressure, sore throat, ear pressure, started a week ago,     Patient is a 42 year old female seen for acute concern.  Patient endorses feeling sick during recent trip to [place], [place] and be oriented expressed.  Patient states on the return flight she had increased ear pain/difficulty with ears popping.  Patient initially had sore throat which has resolved.  Still with nasal congestion, rhinorrhea, facial pain/pressure and edema.  Also having dental pain.  Last antibiotic use was in July.  Patient seen by ENT for recurrent sinus issues.  Cough    Patient Active Problem List   Diagnosis Date Noted   Acute sinusitis 08/02/2022   Chronic maxillary sinusitis 08/02/2022   Right tubo-ovarian mass 11/07/2019   Endometrial polyp 11/07/2019   Elevated CA-125 11/07/2019   Past Medical History:  Diagnosis Date   Chicken pox    Past Surgical History:  Procedure Laterality Date   CYST EXCISION N/A    neck   HYSTEROSCOPY WITH D & C N/A 01/14/2020   Procedure: DILATATION AND CURETTAGE /HYSTEROSCOPY;  Surgeon: Adolphus Birchwood, MD;  Location: [facility];  Service: Gynecology;  Laterality: N/A;   ROBOTIC ASSISTED SALPINGO OOPHERECTOMY N/A 01/14/2020   Procedure: XI ROBOTIC ASSISTED RIGHT SALPINGO-OOPHORECTOMY, LEFT OVARIAN CYSTECTOMY, LYSIS OF ADHESIONS;  Surgeon: Adolphus Birchwood, MD;  Location: [facility];  Service: Gynecology;  Laterality: N/A;   WISDOM TOOTH EXTRACTION     Social History   Tobacco Use   Smoking status: Never   Smokeless tobacco: Never  Vaping Use   Vaping status: Never Used  Substance Use Topics   Alcohol use: Yes    Comment: once-twice a week   Drug use: Never   Family History  Problem Relation Age of Onset   Hypertension Father    Hyperlipidemia Father    No  Known Allergies    Review of Systems  Respiratory:  Positive for cough.    Negative unless stated above    Objective:     BP 120/74 (BP Location: Right Arm, Patient Position: Sitting, Cuff Size: Normal)   Pulse (!) 112   Temp 99.4 F (37.4 C) (Oral)   Ht 5\' 3"  (1.6 m)   Wt 124 lb (56.2 kg)   SpO2 98%   BMI 21.97 kg/m  BP Readings from Last 3 Encounters:  01/16/23 120/74  11/28/22 110/70  10/26/22 110/70   Wt Readings from Last 3 Encounters:  01/16/23 124 lb (56.2 kg)  11/28/22 124 lb 3.2 oz (56.3 kg)  10/26/22 123 lb 8 oz (56 kg)      Physical Exam Constitutional:      General: She is not in acute distress.    Appearance: Normal appearance.  HENT:     Head: Normocephalic and atraumatic.     Right Ear: Tympanic membrane normal. Tympanic membrane is not erythematous.     Left Ear: Tympanic membrane is erythematous. Tympanic membrane is not perforated.     Nose: Nose normal.     Mouth/Throat:     Mouth: Mucous membranes are moist.  Cardiovascular:     Rate and Rhythm: Normal rate and regular rhythm.     Heart sounds: Normal heart sounds. No murmur heard.    No gallop.  Pulmonary:  Effort: Pulmonary effort is normal. No respiratory distress.     Breath sounds: Normal breath sounds. No wheezing, rhonchi or rales.  Skin:    General: Skin is warm and dry.  Neurological:     Mental Status: She is alert and oriented to person, place, and time.      Results for orders placed or performed in visit on 01/16/23  POC COVID-19 BinaxNow  Result Value Ref Range   SARS Coronavirus 2 Ag Negative Negative  POC Influenza A/B  Result Value Ref Range   Influenza A, POC Negative Negative   Influenza B, POC Negative Negative  POC Rapid Strep A  Result Value Ref Range   Rapid Strep A Screen Negative Negative      Assessment & Plan:  Acute serous otitis media of left ear, recurrence not specified -     Cefdinir; Take 1 capsule (300 mg total) by mouth 2 (two) times  daily for 10 days.  Dispense: 20 capsule; Refill: 0  Chronic sinusitis, unspecified location -     Cefdinir; Take 1 capsule (300 mg total) by mouth 2 (two) times daily for 10 days.  Dispense: 20 capsule; Refill: 0  Sore throat -Improved -     POCT rapid strep A  Nasal congestion -     POC COVID-19 BinaxNow -     POCT Influenza A/B  Tachycardia -Hydration encouraged  COVID, flu, strep testing negative in clinic.  Start cefdinir for left AOM and sinusitis.  Continue Flonase, saline nasal rinse, OTC cough/cold medications as needed.  Return if symptoms worsen or fail to improve.   [name], MD

## 2023-01-18 ENCOUNTER — Ambulatory Visit: Payer: BC Managed Care – PPO | Admitting: Allergy

## 2023-01-23 ENCOUNTER — Ambulatory Visit (INDEPENDENT_AMBULATORY_CARE_PROVIDER_SITE_OTHER): Payer: BC Managed Care – PPO | Admitting: Family Medicine

## 2023-01-23 VITALS — BP 130/76 | HR 95 | Temp 98.4°F | Ht 63.0 in | Wt 124.5 lb

## 2023-01-23 DIAGNOSIS — H6992 Unspecified Eustachian tube disorder, left ear: Secondary | ICD-10-CM

## 2023-01-23 NOTE — Progress Notes (Signed)
Established Patient Office Visit  Subjective   Patient ID: Julia Elliott, female    DOB: 04/07/1980  Age: 42 y.o. MRN: [id number]  Chief Complaint  Patient presents with   Ear Pain    Patient complains of left ear pain, x1 week     HPI   Julia Elliott seen for follow-up regarding recent left ear symptoms.  She was diagnosed with serous otitis about a week ago and treated with cefdinir.  She apparently had some erythema of the eardrum at that time.  She states that she had been on a plane a week ago Saturday and had somewhat muffled hearing afterwards.  No severe ear pain.  Feels like her hearing is intact at this time.  Does have some chronic allergies and chronic nasal congestion in spite of antihistamine, Flonase, Singulair.  She does use nasal antihistamines without much benefit in the past.  Occasionally takes Mucinex.  Past Medical History:  Diagnosis Date   Chicken pox    Past Surgical History:  Procedure Laterality Date   CYST EXCISION N/A    neck   HYSTEROSCOPY WITH D & C N/A 01/14/2020   Procedure: DILATATION AND CURETTAGE /HYSTEROSCOPY;  Surgeon: Adolphus Birchwood, MD;  Location: [facility];  Service: Gynecology;  Laterality: N/A;   ROBOTIC ASSISTED SALPINGO OOPHERECTOMY N/A 01/14/2020   Procedure: XI ROBOTIC ASSISTED RIGHT SALPINGO-OOPHORECTOMY, LEFT OVARIAN CYSTECTOMY, LYSIS OF ADHESIONS;  Surgeon: Adolphus Birchwood, MD;  Location: [facility];  Service: Gynecology;  Laterality: N/A;   WISDOM TOOTH EXTRACTION      reports that she has never smoked. She has never used smokeless tobacco. She reports current alcohol use. She reports that she does not use drugs. family history includes Hyperlipidemia in her father; Hypertension in her father. No Known Allergies  Review of Systems  Constitutional:  Negative for chills and fever.  HENT:  Positive for congestion. Negative for hearing loss.       Objective:     BP 130/76 (BP Location: Left Arm, Patient Position: Sitting, Cuff Size: Normal)    Pulse 95   Temp 98.4 F (36.9 C) (Oral)   Ht 5\' 3"  (1.6 m)   Wt 124 lb 8 oz (56.5 kg)   SpO2 99%   BMI 22.05 kg/m  BP Readings from Last 3 Encounters:  01/23/23 130/76  01/16/23 120/74  11/28/22 110/70   Wt Readings from Last 3 Encounters:  01/23/23 124 lb 8 oz (56.5 kg)  01/16/23 124 lb (56.2 kg)  11/28/22 124 lb 3.2 oz (56.3 kg)      Physical Exam Vitals reviewed.  Constitutional:      General: She is not in acute distress.    Appearance: Normal appearance.  HENT:     Right Ear: Tympanic membrane normal.     Left Ear: Tympanic membrane normal.     Ears:     Comments: Right and left eardrums appear normal.  No erythema.  No visible effusion.  Normal landmarks. Pulmonary:     Effort: Pulmonary effort is normal.     Breath sounds: Normal breath sounds.  Neurological:     Mental Status: She is alert.      No results found for any visits on 01/23/23.    The 10-year ASCVD risk score (Arnett DK, et al., 2019) is: 1.5%    Assessment & Plan:   Recent left ear symptoms.  Reported serous otitis a week ago.  Patient currently on cefdinir.  No evidence for any fluid in the ear  at this time.  No erythema.  Eardrum reveals normal landmarks.  May have some eustachian tube dysfunction.  -Continue current allergy medications. -Consider Mucinex 1200 mg twice daily for congestive symptoms -Follow-up for any decrease in hearing, recurrent ear pain, or other concerns   Evelena Peat, MD

## 2023-02-13 ENCOUNTER — Ambulatory Visit (INDEPENDENT_AMBULATORY_CARE_PROVIDER_SITE_OTHER): Payer: BC Managed Care – PPO | Admitting: Allergy

## 2023-02-13 ENCOUNTER — Encounter: Payer: Self-pay | Admitting: Allergy

## 2023-02-13 ENCOUNTER — Other Ambulatory Visit: Payer: Self-pay

## 2023-02-13 VITALS — BP 118/70 | HR 97 | Temp 98.0°F | Resp 18 | Ht 63.39 in | Wt 123.6 lb

## 2023-02-13 DIAGNOSIS — J301 Allergic rhinitis due to pollen: Secondary | ICD-10-CM | POA: Diagnosis not present

## 2023-02-13 DIAGNOSIS — J3089 Other allergic rhinitis: Secondary | ICD-10-CM | POA: Diagnosis not present

## 2023-02-13 DIAGNOSIS — J329 Chronic sinusitis, unspecified: Secondary | ICD-10-CM | POA: Diagnosis not present

## 2023-02-13 MED ORDER — RYALTRIS 665-25 MCG/ACT NA SUSP: 1.0000 | Freq: Two times a day (BID) | NASAL | 5 refills | Status: AC

## 2023-02-13 NOTE — Patient Instructions (Addendum)
Environmental allergies 2024 skin testing positive to grass, weed. Borderline to mold mix #4 and dust mites.  Continue environmental control measures as below. Use over the counter antihistamines such as Zyrtec (cetirizine), Claritin (loratadine), Allegra (fexofenadine), or Xyzal (levocetirizine) daily as needed. May take twice a day during allergy flares. May switch antihistamines every few months. Stop Singulair (montelukast). If you notice worsening symptoms then okay to restart.  Restart Ryaltris (olopatadine + mometasone nasal spray combination) 1-2 sprays per nostril twice a day.  This replaces your other nasal sprays. Nasal saline spray (i.e., Simply Saline) or nasal saline lavage (i.e., NeilMed) is recommended as needed and prior to medicated nasal sprays. Consider allergy injections for long term control if above medications do not help the symptoms.  Get second opinion for ENT.   Infections Keep track of infections and antibiotics use. Get bloodwork to look at immune system.   Return in about 4 months (around 06/14/2023). Or sooner if needed.   Reducing Pollen Exposure Pollen seasons: trees (spring), grass (summer) and ragweed/weeds (fall). Keep windows closed in your home and car to lower pollen exposure.  Install air conditioning in the bedroom and throughout the house if possible.  Avoid going out in dry windy days - especially early morning. Pollen counts are highest between 5 - 10 AM and on dry, hot and windy days.  Save outside activities for late afternoon or after a heavy rain, when pollen levels are lower.  Avoid mowing of grass if you have grass pollen allergy. Be aware that pollen can also be transported indoors on people and pets.  Dry your clothes in an automatic dryer rather than hanging them outside where they might collect pollen.  Rinse hair and eyes before bedtime.  Control of House Dust Mite Allergen Dust mite allergens are a common trigger of allergy and  asthma symptoms. While they can be found throughout the house, these microscopic creatures thrive in warm, humid environments such as bedding, upholstered furniture and carpeting. Because so much time is spent in the bedroom, it is essential to reduce mite levels there.  Encase pillows, mattresses, and box springs in special allergen-proof fabric covers or airtight, zippered plastic covers.  Bedding should be washed weekly in hot water (130 F) and dried in a hot dryer. Allergen-proof covers are available for comforters and pillows that can't be regularly washed.  Wash the allergy-proof covers every few months. Minimize clutter in the bedroom. Keep pets out of the bedroom.  Keep humidity less than 50% by using a dehumidifier or air conditioning. You can buy a humidity measuring device called a hygrometer to monitor this.  If possible, replace carpets with hardwood, linoleum, or washable area rugs. If that's not possible, vacuum frequently with a vacuum that has a HEPA filter. Remove all upholstered furniture and non-washable window drapes from the bedroom. Remove all non-washable stuffed toys from the bedroom.  Wash stuffed toys weekly.  Mold Control Mold and fungi can grow on a variety of surfaces provided certain temperature and moisture conditions exist.  Outdoor molds grow on plants, decaying vegetation and soil. The major outdoor mold, Alternaria and Cladosporium, are found in very high numbers during hot and dry conditions. Generally, a late summer - fall peak is seen for common outdoor fungal spores. Rain will temporarily lower outdoor mold spore count, but counts rise rapidly when the rainy period ends. The most important indoor molds are Aspergillus and Penicillium. Dark, humid and poorly ventilated basements are ideal sites for mold growth.  The next most common sites of mold growth are the bathroom and the kitchen. Outdoor (Seasonal) Mold Control Use air conditioning and keep windows  closed. Avoid exposure to decaying vegetation. Avoid leaf raking. Avoid grain handling. Consider wearing a face mask if working in moldy areas.  Indoor (Perennial) Mold Control  Maintain humidity below 50%. Get rid of mold growth on hard surfaces with water, detergent and, if necessary, 5% bleach (do not mix with other cleaners). Then dry the area completely. If mold covers an area more than 10 square feet, consider hiring an indoor environmental professional. For clothing, washing with soap and water is best. If moldy items cannot be cleaned and dried, throw them away. Remove sources e.g. contaminated carpets. Repair and seal leaking roofs or pipes. Using dehumidifiers in damp basements may be helpful, but empty the water and clean units regularly to prevent mildew from forming. All rooms, especially basements, bathrooms and kitchens, require ventilation and cleaning to deter mold and mildew growth. Avoid carpeting on concrete or damp floors, and storing items in damp areas.

## 2023-02-13 NOTE — Progress Notes (Signed)
Follow Up Note  RE: Julia Elliott MRN: [id number] DOB: [birth year] Date of Office Visit: 02/13/2023  Referring provider: [name], MD Primary care provider: [name], MD  Chief Complaint: Allergic Rhinitis  (No change since last visit has concerns if her symptoms are allergy related - daily pressure/ congestion mainly left eye, congestion at night, left ear feels full ) and Sinus Problem (Mid November had a sinus/ ear infection )  History of Present Illness: I had the pleasure of seeing Julia Elliott for a follow up visit at the [facility] on 02/13/2023. She is a 42 y.o. female, who is being followed for allergic rhinitis and recurrent sinusitis. Her previous allergy office visit was on 10/26/2022 with Dr. Selena Batten. Today is a regular follow up visit.  Discussed the use of AI scribe software for clinical note transcription with the patient, who gave verbal consent to proceed.  The patient reports no change in her condition. She has been alternating between Claritin and Allegra every few months for symptom management. She also takes Singulair at night. She had been using Flonase daily, but switched to a sample of Ryaltris, which she felt was more effective, especially at night. However, the prescription for Ryaltris expired before she could fill it.  The patient has a history of allergies to grass, weed, and dust mites. Despite the absence of pollen, she continues to experience symptoms. She had a cold in early November that developed into a sinus and ear infection, for which she received antibiotics from her primary care provider. She also has a cyst, identified by an ENT, but it is not believed to be contributing to her symptoms.  The patient does not believe her symptoms are seasonal, describing them as a chronic issue. She reports feeling congestion and pressure, particularly in her ear, and notes that the congestion worsens when lying down. She does not  believe the antihistamines (Singulair and Allegra) are making a significant difference in her symptoms.     Assessment and Plan: Shantai is a 42 y.o. female with: Seasonal allergic rhinitis due to pollen Allergic rhinitis due to dust mite Allergic rhinitis due to mold Past history - mainly in the spring but has frequent sinus infections requiring 5-6 courses of antibiotics per year. ENT evaluation showed a retention cyst. Tried Claritin and Flonase with some benefit. Travels a lot. 2024 skin testing positive to grass, weed. Borderline to mold mix #4 and dust mites.  Continue environmental control measures as below. Use over the counter antihistamines such as Zyrtec (cetirizine), Claritin (loratadine), Allegra (fexofenadine), or Xyzal (levocetirizine) daily as needed. May take twice a day during allergy flares. May switch antihistamines every few months. Stop Singulair (montelukast). If you notice worsening symptoms then okay to restart.  Restart Ryaltris (olopatadine + mometasone nasal spray combination) 1-2 sprays per nostril twice a day.  This replaces your other nasal sprays. Nasal saline spray (i.e., Simply Saline) or nasal saline lavage (i.e., NeilMed) is recommended as needed and prior to medicated nasal sprays. Consider allergy injections for long term control if above medications do not help the symptoms.  Get second opinion for ENT.    Recurrent sinusitis Keep track of infections and antibiotics use. Get bloodwork to look at immune system.   Return in about 4 months (around 06/14/2023).  Meds ordered this encounter  Medications   Olopatadine-Mometasone (RYALTRIS) 665-25 MCG/ACT SUSP    Sig: Place 1-2 sprays into the nose in the morning and at  bedtime.    Dispense:  29 g    Refill:  5    [phone]   Lab Orders         CBC with Differential/Platelet         IgG, IgA, IgM         Diphtheria / Tetanus Antibody Panel         Strep pneumoniae 23 Serotypes IgG          Complement, total      Diagnostics: None.  Medication List:  Current Outpatient Medications  Medication Sig Dispense Refill   cyclobenzaprine (FLEXERIL) 5 MG tablet Take 1 tablet (5 mg total) by mouth at bedtime as needed. 30 tablet 0   ibuprofen (ADVIL) 200 MG tablet Take 400 mg by mouth every 6 (six) hours as needed for fever, headache or mild pain.     loratadine (CLARITIN) 10 MG tablet Take 10 mg by mouth daily.     montelukast (SINGULAIR) 10 MG tablet Take 1 tablet (10 mg total) by mouth at bedtime. 30 tablet 3   Multiple Vitamin (MULTIVITAMIN ADULT PO) Take 1 tablet by mouth daily.      Norethindrone Acetate-Ethinyl Estradiol (LOESTRIN) 1.5-30 MG-MCG tablet Do not take the inactive pills at the end of the pack, Continue to new pack and take active pills continuously 28 tablet 11   nystatin (MYCOSTATIN/NYSTOP) powder Apply 1 Application topically 3 (three) times daily. 15 g 0   Olopatadine-Mometasone (RYALTRIS) 665-25 MCG/ACT SUSP Place 1-2 sprays into the nose in the morning and at bedtime. 29 g 5   No current facility-administered medications for this visit.   Allergies: No Known Allergies I reviewed her past medical history, social history, family history, and environmental history and no significant changes have been reported from her previous visit.  Review of Systems  Constitutional:  Negative for appetite change, chills, fever and unexpected weight change.  HENT:  Positive for congestion, postnasal drip, rhinorrhea and sinus pressure.   Eyes:  Negative for itching.  Respiratory:  Negative for cough, chest tightness, shortness of breath and wheezing.   Cardiovascular:  Negative for chest pain.  Gastrointestinal:  Negative for abdominal pain.  Genitourinary:  Negative for difficulty urinating.  Skin:  Negative for rash.  Allergic/Immunologic: Positive for environmental allergies.  Neurological:  Negative for headaches.    Objective: BP 118/70   Pulse 97   Temp 98 F  (36.7 C)   Resp 18   Ht 5' 3.39" (1.61 m)   Wt 123 lb 9.6 oz (56.1 kg)   SpO2 98%   BMI 21.63 kg/m  Body mass index is 21.63 kg/m. Physical Exam Vitals and nursing note reviewed.  Constitutional:      Appearance: Normal appearance. She is well-developed.  HENT:     Head: Normocephalic and atraumatic.     Right Ear: Tympanic membrane and external ear normal.     Left Ear: Tympanic membrane and external ear normal.     Nose: Nose normal.     Mouth/Throat:     Mouth: Mucous membranes are moist.     Pharynx: Oropharynx is clear.  Eyes:     Conjunctiva/sclera: Conjunctivae normal.  Cardiovascular:     Rate and Rhythm: Normal rate and regular rhythm.     Heart sounds: Normal heart sounds. No murmur heard.    No friction rub. No gallop.  Pulmonary:     Effort: Pulmonary effort is normal.     Breath sounds: Normal breath sounds. No wheezing,  rhonchi or rales.  Musculoskeletal:     Cervical back: Neck supple.  Skin:    General: Skin is warm.     Findings: No rash.  Neurological:     Mental Status: She is alert and oriented to person, place, and time.  Psychiatric:        Behavior: Behavior normal.   Previous notes and tests were reviewed. The plan was reviewed with the patient/family, and all questions/concerned were addressed.  It was my pleasure to see Julia Elliott today and participate in her care. Please feel free to contact me with any questions or concerns.  Sincerely,  Wyline Mood, DO Allergy & Immunology  [facility] office: [phone] [facility] office: [phone]

## 2023-02-17 LAB — STREP PNEUMONIAE 23 SEROTYPES IGG
Pneumo Ab Type 1*: 0.4 ug/mL — ABNORMAL LOW (ref >1.3)
Pneumo Ab Type 12 (12F)*: <0.1 ug/mL — ABNORMAL LOW (ref >1.3)
Pneumo Ab Type 14*: 0.2 ug/mL — ABNORMAL LOW (ref >1.3)
Pneumo Ab Type 17 (17F)*: 0.8 ug/mL — ABNORMAL LOW (ref >1.3)
Pneumo Ab Type 19 (19F)*: <0.1 ug/mL — ABNORMAL LOW (ref >1.3)
Pneumo Ab Type 2*: 1 ug/mL — ABNORMAL LOW (ref >1.3)
Pneumo Ab Type 20*: 1.4 ug/mL (ref >1.3)
Pneumo Ab Type 22 (22F)*: 0.6 ug/mL — ABNORMAL LOW (ref >1.3)
Pneumo Ab Type 23 (23F)*: <0.1 ug/mL — ABNORMAL LOW (ref >1.3)
Pneumo Ab Type 26 (6B)*: <0.1 ug/mL — ABNORMAL LOW (ref >1.3)
Pneumo Ab Type 3*: <0.1 ug/mL — ABNORMAL LOW (ref >1.3)
Pneumo Ab Type 34 (10A)*: 0.3 ug/mL — ABNORMAL LOW (ref >1.3)
Pneumo Ab Type 4*: 0.3 ug/mL — ABNORMAL LOW (ref >1.3)
Pneumo Ab Type 43 (11A)*: 0.7 ug/mL — ABNORMAL LOW (ref >1.3)
Pneumo Ab Type 5*: <0.1 ug/mL — ABNORMAL LOW (ref >1.3)
Pneumo Ab Type 51 (7F)*: <0.1 ug/mL — ABNORMAL LOW (ref >1.3)
Pneumo Ab Type 54 (15B)*: <0.2 ug/mL — ABNORMAL LOW (ref >1.3)
Pneumo Ab Type 56 (18C)*: <0.1 ug/mL — ABNORMAL LOW (ref >1.3)
Pneumo Ab Type 57 (19A)*: 0.6 ug/mL — ABNORMAL LOW (ref >1.3)
Pneumo Ab Type 68 (9V)*: <0.1 ug/mL — ABNORMAL LOW (ref >1.3)
Pneumo Ab Type 70 (33F)*: 0.9 ug/mL — ABNORMAL LOW (ref >1.3)
Pneumo Ab Type 8*: <0.3 ug/mL — ABNORMAL LOW (ref >1.3)
Pneumo Ab Type 9 (9N)*: 0.7 ug/mL — ABNORMAL LOW (ref >1.3)

## 2023-02-17 LAB — IGG, IGA, IGM
IgA/Immunoglobulin A, Serum: 121 mg/dL (ref 87–352)
IgG (Immunoglobin G), Serum: 1180 mg/dL (ref 586–1602)
IgM (Immunoglobulin M), Srm: 187 mg/dL (ref 26–217)

## 2023-02-17 LAB — CBC WITH DIFFERENTIAL/PLATELET
Basophils Absolute: 0 10*3/uL (ref 0.0–0.2)
Basos: 1 %
EOS (ABSOLUTE): 0.1 10*3/uL (ref 0.0–0.4)
Eos: 2 %
Hematocrit: 43.5 % (ref 34.0–46.6)
Hemoglobin: 14.4 g/dL (ref 11.1–15.9)
Immature Grans (Abs): 0 10*3/uL (ref 0.0–0.1)
Immature Granulocytes: 0 %
Lymphocytes Absolute: 1.6 10*3/uL (ref 0.7–3.1)
Lymphs: 40 %
MCH: 31.2 pg (ref 26.6–33.0)
MCHC: 33.1 g/dL (ref 31.5–35.7)
MCV: 94 fL (ref 79–97)
Monocytes Absolute: 0.1 10*3/uL (ref 0.1–0.9)
Monocytes: 3 %
Neutrophils Absolute: 2.2 10*3/uL (ref 1.4–7.0)
Neutrophils: 54 %
Platelets: 203 10*3/uL (ref 150–450)
RBC: 4.61 x10E6/uL (ref 3.77–5.28)
RDW: 12.5 % (ref 11.7–15.4)
WBC: 4.1 10*3/uL (ref 3.4–10.8)

## 2023-02-17 LAB — COMPLEMENT, TOTAL: Compl, Total (CH50): 58 U/mL (ref >41)

## 2023-02-17 LAB — DIPHTHERIA / TETANUS ANTIBODY PANEL
Diphtheria Ab: 0.29 [IU]/mL (ref <0.10)
Tetanus Ab, IgG: 0.21 [IU]/mL (ref <0.10)

## 2023-03-08 DIAGNOSIS — Z23 Encounter for immunization: Secondary | ICD-10-CM | POA: Diagnosis not present

## 2023-04-11 DIAGNOSIS — M542 Cervicalgia: Secondary | ICD-10-CM | POA: Diagnosis not present

## 2023-04-11 DIAGNOSIS — R519 Headache, unspecified: Secondary | ICD-10-CM | POA: Diagnosis not present

## 2023-04-11 DIAGNOSIS — E782 Mixed hyperlipidemia: Secondary | ICD-10-CM | POA: Diagnosis not present

## 2023-04-11 DIAGNOSIS — J309 Allergic rhinitis, unspecified: Secondary | ICD-10-CM | POA: Diagnosis not present

## 2023-04-11 DIAGNOSIS — Z23 Encounter for immunization: Secondary | ICD-10-CM | POA: Diagnosis not present

## 2023-04-11 DIAGNOSIS — Z0000 Encounter for general adult medical examination without abnormal findings: Secondary | ICD-10-CM | POA: Diagnosis not present

## 2023-06-02 DIAGNOSIS — E782 Mixed hyperlipidemia: Secondary | ICD-10-CM | POA: Diagnosis not present

## 2023-06-05 DIAGNOSIS — J342 Deviated nasal septum: Secondary | ICD-10-CM | POA: Diagnosis not present

## 2023-06-05 DIAGNOSIS — J329 Chronic sinusitis, unspecified: Secondary | ICD-10-CM | POA: Diagnosis not present

## 2023-07-04 DIAGNOSIS — R9389 Abnormal findings on diagnostic imaging of other specified body structures: Secondary | ICD-10-CM | POA: Diagnosis not present

## 2023-07-04 DIAGNOSIS — J320 Chronic maxillary sinusitis: Secondary | ICD-10-CM | POA: Diagnosis not present

## 2023-07-04 DIAGNOSIS — J3489 Other specified disorders of nose and nasal sinuses: Secondary | ICD-10-CM | POA: Diagnosis not present

## 2023-07-04 DIAGNOSIS — J358 Other chronic diseases of tonsils and adenoids: Secondary | ICD-10-CM | POA: Diagnosis not present

## 2023-07-04 DIAGNOSIS — R519 Headache, unspecified: Secondary | ICD-10-CM | POA: Diagnosis not present

## 2023-07-04 DIAGNOSIS — J343 Hypertrophy of nasal turbinates: Secondary | ICD-10-CM | POA: Diagnosis not present

## 2023-07-04 DIAGNOSIS — G501 Atypical facial pain: Secondary | ICD-10-CM | POA: Diagnosis not present

## 2023-07-04 DIAGNOSIS — J342 Deviated nasal septum: Secondary | ICD-10-CM | POA: Diagnosis not present

## 2023-07-04 DIAGNOSIS — J301 Allergic rhinitis due to pollen: Secondary | ICD-10-CM | POA: Diagnosis not present

## 2023-07-06 DIAGNOSIS — G43009 Migraine without aura, not intractable, without status migrainosus: Secondary | ICD-10-CM | POA: Diagnosis not present

## 2023-07-07 DIAGNOSIS — R9389 Abnormal findings on diagnostic imaging of other specified body structures: Secondary | ICD-10-CM | POA: Diagnosis not present

## 2023-07-11 DIAGNOSIS — J320 Chronic maxillary sinusitis: Secondary | ICD-10-CM | POA: Diagnosis not present

## 2023-07-11 DIAGNOSIS — R9389 Abnormal findings on diagnostic imaging of other specified body structures: Secondary | ICD-10-CM | POA: Diagnosis not present

## 2023-07-11 DIAGNOSIS — J358 Other chronic diseases of tonsils and adenoids: Secondary | ICD-10-CM | POA: Diagnosis not present

## 2023-07-11 DIAGNOSIS — J3489 Other specified disorders of nose and nasal sinuses: Secondary | ICD-10-CM | POA: Diagnosis not present

## 2023-08-29 DIAGNOSIS — R519 Headache, unspecified: Secondary | ICD-10-CM | POA: Diagnosis not present

## 2023-08-29 DIAGNOSIS — Z79899 Other long term (current) drug therapy: Secondary | ICD-10-CM | POA: Diagnosis not present

## 2023-10-03 DIAGNOSIS — F339 Major depressive disorder, recurrent, unspecified: Secondary | ICD-10-CM | POA: Diagnosis not present

## 2023-10-03 DIAGNOSIS — F4322 Adjustment disorder with anxiety: Secondary | ICD-10-CM | POA: Diagnosis not present

## 2023-10-11 DIAGNOSIS — F4322 Adjustment disorder with anxiety: Secondary | ICD-10-CM | POA: Diagnosis not present

## 2023-10-11 DIAGNOSIS — F339 Major depressive disorder, recurrent, unspecified: Secondary | ICD-10-CM | POA: Diagnosis not present

## 2023-12-13 DIAGNOSIS — J329 Chronic sinusitis, unspecified: Secondary | ICD-10-CM | POA: Diagnosis not present

## 2023-12-13 DIAGNOSIS — R519 Headache, unspecified: Secondary | ICD-10-CM | POA: Diagnosis not present

## 2023-12-13 DIAGNOSIS — Z79899 Other long term (current) drug therapy: Secondary | ICD-10-CM | POA: Diagnosis not present

## 2023-12-15 DIAGNOSIS — Z1231 Encounter for screening mammogram for malignant neoplasm of breast: Secondary | ICD-10-CM | POA: Diagnosis not present

## 2023-12-15 DIAGNOSIS — Z01419 Encounter for gynecological examination (general) (routine) without abnormal findings: Secondary | ICD-10-CM | POA: Diagnosis not present

## 2023-12-15 DIAGNOSIS — Z1331 Encounter for screening for depression: Secondary | ICD-10-CM | POA: Diagnosis not present

## 2024-01-01 DIAGNOSIS — M4722 Other spondylosis with radiculopathy, cervical region: Secondary | ICD-10-CM | POA: Diagnosis not present

## 2024-02-15 DIAGNOSIS — R030 Elevated blood-pressure reading, without diagnosis of hypertension: Secondary | ICD-10-CM | POA: Diagnosis not present

## 2024-02-15 DIAGNOSIS — J0140 Acute pansinusitis, unspecified: Secondary | ICD-10-CM | POA: Diagnosis not present
